# Patient Record
Sex: Female | Born: 1985 | Race: White | Hispanic: No | Marital: Married
Health system: Midwestern US, Community
[De-identification: ages and names within clinical notes are randomized; demographics above are authoritative.]

## PROBLEM LIST (undated history)

## (undated) ENCOUNTER — Inpatient Hospital Stay (HOSPITAL_COMMUNITY): Payer: Self-pay

## (undated) DIAGNOSIS — J45901 Unspecified asthma with (acute) exacerbation: Secondary | ICD-10-CM

## (undated) DIAGNOSIS — O24419 Gestational diabetes mellitus in pregnancy, unspecified control: Secondary | ICD-10-CM

## (undated) DIAGNOSIS — N83209 Unspecified ovarian cyst, unspecified side: Secondary | ICD-10-CM

## (undated) DIAGNOSIS — E282 Polycystic ovarian syndrome: Secondary | ICD-10-CM

## (undated) HISTORY — PX: OTHER SURGICAL HISTORY: SHX169

## (undated) HISTORY — DX: Polycystic ovarian syndrome: E28.2

## (undated) HISTORY — PX: WISDOM TOOTH EXTRACTION: SHX21

---
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
## 2003-05-28 ENCOUNTER — Encounter: Admission: RE | Admit: 2003-05-28 | Discharge: 2003-08-26 | Payer: Self-pay | Admitting: Psychology

## 2004-11-26 ENCOUNTER — Emergency Department (HOSPITAL_COMMUNITY): Admission: EM | Admit: 2004-11-26 | Discharge: 2004-11-26 | Payer: Self-pay

## 2006-05-14 ENCOUNTER — Inpatient Hospital Stay (HOSPITAL_COMMUNITY): Admission: AD | Admit: 2006-05-14 | Discharge: 2006-05-15 | Payer: Self-pay | Admitting: Gynecology

## 2006-05-17 ENCOUNTER — Inpatient Hospital Stay (HOSPITAL_COMMUNITY): Admission: AD | Admit: 2006-05-17 | Discharge: 2006-05-17 | Payer: Self-pay | Admitting: Obstetrics and Gynecology

## 2006-06-01 ENCOUNTER — Encounter: Payer: Self-pay | Admitting: Obstetrics and Gynecology

## 2006-06-01 ENCOUNTER — Ambulatory Visit: Payer: Self-pay | Admitting: Obstetrics and Gynecology

## 2006-07-01 ENCOUNTER — Ambulatory Visit (HOSPITAL_COMMUNITY): Admission: RE | Admit: 2006-07-01 | Discharge: 2006-07-01 | Payer: Self-pay | Admitting: Obstetrics and Gynecology

## 2006-08-10 ENCOUNTER — Ambulatory Visit: Payer: Self-pay | Admitting: Obstetrics & Gynecology

## 2006-09-21 ENCOUNTER — Ambulatory Visit (HOSPITAL_COMMUNITY): Admission: RE | Admit: 2006-09-21 | Discharge: 2006-09-21 | Payer: Self-pay | Admitting: Obstetrics & Gynecology

## 2006-09-21 ENCOUNTER — Ambulatory Visit: Payer: Self-pay | Admitting: Obstetrics & Gynecology

## 2006-09-21 ENCOUNTER — Encounter: Payer: Self-pay | Admitting: Obstetrics & Gynecology

## 2006-09-23 ENCOUNTER — Inpatient Hospital Stay (HOSPITAL_COMMUNITY): Admission: AD | Admit: 2006-09-23 | Discharge: 2006-09-23 | Payer: Self-pay | Admitting: Gynecology

## 2006-10-05 ENCOUNTER — Ambulatory Visit: Payer: Self-pay | Admitting: Gynecology

## 2006-10-25 ENCOUNTER — Ambulatory Visit: Payer: Self-pay | Admitting: Physician Assistant

## 2006-10-25 ENCOUNTER — Inpatient Hospital Stay (HOSPITAL_COMMUNITY): Admission: AD | Admit: 2006-10-25 | Discharge: 2006-10-25 | Payer: Self-pay | Admitting: Gynecology

## 2007-03-05 ENCOUNTER — Inpatient Hospital Stay (HOSPITAL_COMMUNITY): Admission: AD | Admit: 2007-03-05 | Discharge: 2007-03-05 | Payer: Self-pay | Admitting: Obstetrics and Gynecology

## 2007-11-16 ENCOUNTER — Inpatient Hospital Stay (HOSPITAL_COMMUNITY): Admission: AD | Admit: 2007-11-16 | Discharge: 2007-11-16 | Payer: Self-pay | Admitting: Gynecology

## 2008-02-12 ENCOUNTER — Inpatient Hospital Stay (HOSPITAL_COMMUNITY): Admission: AD | Admit: 2008-02-12 | Discharge: 2008-02-12 | Payer: Self-pay | Admitting: Obstetrics & Gynecology

## 2008-10-01 ENCOUNTER — Emergency Department (HOSPITAL_COMMUNITY): Admission: EM | Admit: 2008-10-01 | Discharge: 2008-10-01 | Payer: Self-pay | Admitting: Emergency Medicine

## 2009-05-03 HISTORY — PX: OVARIAN CYST REMOVAL: SHX89

## 2010-09-15 NOTE — H&P (Signed)
Shawna Travis, Shawna Travis                 ACCOUNT NO.:  [id number]   MEDICAL RECORD NO.:  [id number]          PATIENT TYPE:  WOC   LOCATION:  WOC                          FACILITY:  [facility]   PHYSICIAN:  Lesly Dukes, M.D. DATE OF BIRTH:  06/20/1985   DATE OF ADMISSION:  09/21/2006  DATE OF DISCHARGE:                              HISTORY & PHYSICAL   The patient is a 25 year old female, G6, para 0-0-6-0, who has a known  dermoid cyst diagnosed in 2006 and then ultrasound repeated in February  2008, which shows a 4.9 x 3.7 x 4.4 complex cyst in the right ovary.  She has occasional pain but nothing acute.  The patient was consented  for removal on August 10, 2006, which include bleeding, infection, damage  to the intra-abdominal organs, bowel, bladder, ureter, ovaries and  fallopian tubes.   PAST MEDICAL HISTORY:  High cholesterol.   PAST SURGICAL HISTORY:  None.   PAST OBSTETRICAL HISTORY:  Six first trimester miscarriages with a  __________ -negative workup.   GYN HISTORY:  Last Pap smear was done in January 2008, which was  negative.  She has never had a mammogram.  She is not on birth control  and would like to become pregnant.  She has never had a colonoscopy.  She had menarche at age 57.  Her cycles are 27 days long and periods  last 5 days.  Flow is medium with mild pain and no bleeding between  periods.  She has had no other ovarian cysts, fibroid tumors or sexually  transmitted diseases that she reports.   ALLERGIES:  PENICILLIN and MORPHINE.   FAMILY HISTORY:  Aunt has diabetes.  Father has high blood pressure.   REVIEW OF SYSTEMS:  Last visit was negative for chest pain, shortness of  breath.  She does have frequent headaches and dizzy spells.  Has had  some weight gain; however, her TSH is normal.   SOCIAL HISTORY:  She lives with her boyfriend.  She smokes 1 pack per  day for 6 years.  She drinks 5 caffeinated beverages a day and drinks no  alcohol.   PHYSICAL  EXAMINATION:  VITAL SIGNS:  During her last visit, pulse 84,  blood pressure 103/69, weight 161.8, height 5 feet 3 inches.  GENERAL:  Well-nourished, well-developed, no apparent distress.  HEENT:  Normocephalic, atraumatic.  NECK:  Supple.  Thyroid symmetrical with no dominant masses.  BREASTS:  Within normal limits.  HEART:  Regular rate and rhythm.  LUNGS:  Clear to auscultation bilaterally.  ABDOMEN:  Soft, nontender.  No mass, no organomegaly.  GENITALIA:  Tanner V.  Vagina pink, normal rugae.  Cervix clean,  nulliparous, anteverted.  Adnexa:  No masses, nontender.  No cul-de-sac  mass.  EXTREMITIES:  No edema, nontender.   ASSESSMENT AND PLAN:  A 25 year old female with dermoid cyst.  The  patient is to have diagnostic laparoscopy, cystectomy and possible  oophorectomy.  Patient for surgery May 21.           ______________________________  Lesly Dukes, M.D.     KHL/MEDQ  D:  09/19/2006  T:  09/20/2006  Job:  [id number]

## 2010-09-15 NOTE — Op Note (Signed)
Shawna Travis, ASFOUR                 ACCOUNT NO.:  [id number]   MEDICAL RECORD NO.:  [id number]          PATIENT TYPE:  AMB   LOCATION:  SDC                           [facility]   PHYSICIAN:  Lesly Dukes, M.D. DATE OF BIRTH:  04/02/1986   DATE OF PROCEDURE:  09/21/2006  DATE OF DISCHARGE:                               OPERATIVE REPORT   PREOPERATIVE DIAGNOSIS:  A 25 year old female with right dermoid cyst.   POSTOPERATIVE DIAGNOSES:  52. A 25 year old female with right dermoid cyst.  2. Grossly normal uterus, ovaries and fallopian tubes otherwise.   PROCEDURE PERFORMED:  1. Diagnostic laparoscopy.  2. Right ovarian cystectomy.   SURGEON:  Lesly Dukes, M.D.   ASSISTANT:  Ginger Carne, M.D.   ANESTHESIA:  General.   SPECIMENS:  Right ovarian cyst.   ESTIMATED BLOOD LOSS:  Minimal.   COMPLICATIONS:  None.   PROCEDURE:  After informed consent was obtained, the patient was taken  to the operating room where general anesthesia was found to be adequate.  The patient was placed in dorsal lithotomy position and prepped and  draped in the normal sterile fashion.  A Foley was in the bladder.   The uterine retractor was placed into the uterus and the uterus was  anteverted.  Gown and gloves changed.   Using the open method, an infraumbilical skin incision was made and  carried down under direct visualization to the peritoneum which was then  entered bluntly.  Retractors were placed into the abdomen and a Hassan  trocar was introduced intra-abdominally.  Pneumoperitoneum was achieved.  The fascia was also tagged with 0 Vicryl.   Survey of abdominal contents revealed an approximately 4 x 4 right  ovarian cyst.  The left ovary had a corpus luteum.  The uterus was  grossly normal.  No evidence of endometriosis or scarring.  The base of  the cyst was incised with the EndoShears and then, using graspers, the  dermoid cyst was pulled away.  Hemostasis was achieved with  bipolar  cautery using an Endopouch and a 5 mm scope.  The dermoid was removed  from the umbilicus.   The umbilical skin incision had to be extended a few mm in order to  achieve removal of the right ovarian cyst. Once the cyst was removed,  the 10 mm scope was introduced into the abdomen and hemostasis was  assured.  All instruments were removed from the patient's abdomen.  The  pneumoperitoneum was released.  The laparoscope and Hassan trocar were  removed in one single motion.   The fascia was visualized and closed with 0 Vicryl in a running fashion.  Subcutaneous tissue was hemostatic and the skin in the umbilical  incision was closed in a subcutaneous manner with 4-0 Vicryl.  The three  port sites, that were in the lower quadrants, were closed with  Dermabond.  The Pelosi retractor was removed from the cervix and the  cervix was hemostatic.   The patient tolerated the procedure well.  The sponge, lap, instrument  and needle count were correct x two.  The patient went to the recovery  room in stable condition.           ______________________________  Lesly Dukes, M.D.     KHL/MEDQ  D:  09/21/2006  T:  09/21/2006  Job:  [id number]

## 2010-09-18 NOTE — Group Therapy Note (Signed)
NAMEFLETCHER, Shawna Travis                 ACCOUNT NO.:  [id number]   MEDICAL RECORD NO.:  [id number]          PATIENT TYPE:  WOC   LOCATION:  [facility]                   FACILITY:  [facility]   PHYSICIAN:  Elsie Lincoln, MD      DATE OF BIRTH:  February 13, 1986   DATE OF SERVICE:                                  CLINIC NOTE   FOLLOWUP:  The patient is a 25 year old female who presents for follow  up of her dermoid cyst.  The dermoid cyst is still there, which measures  approximately 4.9 x 3.7 x 4.4.  It is stable since 2006.  Patient  understands that this needs to come out surgically and will plan for a  laparoscopic cystectomy.  She understands it will be done in the next 2  months.  She has consented for the procedure which includes bleeding,  infection, damage to the intraabdominal organs.  She has had multiple  miscarriages in the past.  She had a limited workup, which included TSH,  ANA, anti-cardiolipin, which were all negative.  We just did a random  glucose today which was 89, so I do not believe she has diabetes either.  If she obtains insurance, we could do more workup for her recurrent  miscarriages.   PLAN:  Patient will be scheduled for surgery and sent a letter.           ______________________________  Elsie Lincoln, MD     KL/MEDQ  D:  08/10/2006  T:  08/10/2006  Job:  [id number]
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
## 2011-01-29 LAB — WET PREP, GENITAL: Trich, Wet Prep: NONE SEEN

## 2011-01-29 LAB — CBC
MCHC: 34.4
Platelets: 257
RBC: 4.7

## 2011-01-29 LAB — URINALYSIS, ROUTINE W REFLEX MICROSCOPIC
Hgb urine dipstick: NEGATIVE
Ketones, ur: NEGATIVE
Protein, ur: NEGATIVE
Specific Gravity, Urine: 1.01
Urobilinogen, UA: 0.2
pH: 5.5

## 2011-02-01 LAB — URINALYSIS, ROUTINE W REFLEX MICROSCOPIC
Glucose, UA: NEGATIVE
Hgb urine dipstick: NEGATIVE
Ketones, ur: NEGATIVE
Protein, ur: NEGATIVE
Specific Gravity, Urine: 1.015
Urobilinogen, UA: 0.2
pH: 7

## 2011-02-09 LAB — POCT PREGNANCY, URINE
Operator id: 26329
Operator id: 263291
Preg Test, Ur: NEGATIVE

## 2011-02-09 LAB — URINALYSIS, ROUTINE W REFLEX MICROSCOPIC
Bilirubin Urine: NEGATIVE
Glucose, UA: NEGATIVE
Urobilinogen, UA: 0.2

## 2011-02-09 LAB — CBC
HCT: 40.9
Hemoglobin: 14.5
MCV: 91.2
Platelets: 296
RDW: 12.6
WBC: 8

## 2011-02-09 LAB — URINE MICROSCOPIC-ADD ON

## 2011-02-09 LAB — HCG, QUANTITATIVE, PREGNANCY: hCG, Beta Chain, Quant, S: <2

## 2012-05-27 ENCOUNTER — Ambulatory Visit: Payer: BC Managed Care – PPO

## 2012-05-27 ENCOUNTER — Ambulatory Visit: Payer: BC Managed Care – PPO | Admitting: Emergency Medicine

## 2012-05-27 VITALS — BP 111/71 | HR 68 | Temp 97.7°F | Resp 16 | Ht 64.5 in | Wt 185.6 lb

## 2012-05-27 DIAGNOSIS — M2390 Unspecified internal derangement of unspecified knee: Secondary | ICD-10-CM

## 2012-05-27 MED ORDER — HYDROCODONE-ACETAMINOPHEN 5-325 MG PO TABS: 1.0000 | ORAL_TABLET | ORAL | Status: DC | PRN

## 2012-05-27 NOTE — Progress Notes (Signed)
[facility] [address] [place] [address] [phone]  Date:  05/27/2012   Name:  Shawna Travis   DOB:  11/21/1985   MRN:  [id number]  PCP:  No primary provider on file.    Chief Complaint: Knee Pain   History of Present Illness:  Shawna Travis is a 27 y.o. very pleasant female patient who presents with the following:  Slipped on the floor on a patch of flour and says her left knee "gave out" and she heard a pop and she fell to the floor. Occurred night before last.  No prior knee injury.   Not clicking or locking, just stiff and painful.  Unable to sleep last night or work due to pain.  There is no problem list on file for this patient.   History reviewed. No pertinent past medical history.  Past Surgical History  Procedure Date  . Cyst on ovary     History  Substance Use Topics  . Smoking status: Current Every Day Smoker -- 15.0 packs/day    Types: Cigarettes  . Smokeless tobacco: Not on file  . Alcohol Use: Yes    Family History  Problem Relation Age of Onset  . Hypertension Father   . Hyperlipidemia Father   . Heart disease Sister   . Heart disease Maternal Grandmother     Allergies  Allergen Reactions  . Sulfa Antibiotics Other (See Comments)    Red Blotches    Medication list has been reviewed and updated.  No current outpatient prescriptions on file prior to visit.    Review of Systems:  As per HPI, otherwise negative.    Physical Examination: Filed Vitals:   05/27/12 1211  BP: 111/71  Pulse: 68  Temp: 97.7 F (36.5 C)  Resp: 16   Filed Vitals:   05/27/12 1211  Height: 5' 4.5" (1.638 m)  Weight: 185 lb 9.6 oz (84.188 kg)   Body mass index is 31.37 kg/(m^2). Ideal Body Weight: Weight in (lb) to have BMI = 25: 147.6    GEN: WDWN, NAD, Non-toxic, Alert & Oriented x 3 HEENT: Atraumatic, Normocephalic.  Ears and Nose: No external deformity. EXTR: No clubbing/cyanosis/edema NEURO: Normal gait.  PSYCH:  Normally interactive. Conversant. Not depressed or anxious appearing.  Calm demeanor.  Knee:  Left knee swollen and tender. Moderate effusion.  Guards flexion past 90 degrees and full extension.  Grossly stable  Assessment and Plan: Internal derangement knee Knee immobilizer Crutches NWB vicodin Ice and elevation Follow up in one week  Carmelina Dane, MD

## 2012-05-27 NOTE — Progress Notes (Signed)
  Subjective:    Patient ID: Shawna Travis, female    DOB: 13-Nov-1985, 27 y.o.   MRN: [id number]  HPI    Review of Systems     Objective:   Physical Exam        Assessment & Plan:  [facility] reading (PRIMARY ) by  Dr. Dareen Piano.  Normal knee.

## 2012-05-27 NOTE — Patient Instructions (Addendum)
Knee Sprain  A knee sprain is a tear in one of the strong, fibrous tissues that connect the bones (ligaments) in your knee. The severity of the sprain depends on how much of the ligament is torn. The tear can be either partial or complete.  CAUSES   Often, sprains are a result of a fall or injury. The force of the impact causes the fibers of your ligament to stretch too much. This excess tension causes the fibers of your ligament to tear.  SYMPTOMS   You may have some loss of motion in your knee. Other symptoms include:   Bruising.   Tenderness.   Swelling.  DIAGNOSIS   In order to diagnose knee sprain, your caregiver will physically examine your knee to determine how torn the ligament is. Your caregiver may also suggest an X-ray exam of your knee to make sure no bones are broken.  TREATMENT   If your ligament is only partially torn, treatment usually involves keeping the knee in a fixed position (immobilization) or bracing your knee for activities that require movement for several weeks. To do this, your caregiver will apply a bandage, cast, or splint to keep your knee from moving or support your knee during movement until it heals. For a partially torn ligament, the healing process usually takes 4 to 6 weeks.  If your ligament is completely torn, depending on which ligament it is, you may need surgery to reconnect the ligament to the bone or reconstruct it. After surgery, a cast or splint may be applied and will need to stay on your knee for 4 to 6 weeks while your ligament heals.  HOME CARE INSTRUCTIONS   Keep your injured knee elevated to decrease swelling.   To ease pain and swelling, apply ice to your knee twice a day, for 2 to 3 days:   Put ice in a plastic bag.   Place a towel between your skin and the bag.   Leave the ice on for 15 minutes.   Only take over-the-counter or prescription medicine for pain as directed by your caregiver.   Do not leave your knee unprotected until pain and stiffness go  away (usually 4 to 6 weeks).   Do not allow your cast or splint to get wet. If you have been instructed not to remove it, cover your cast or splint with a plastic bag when you shower or bathe. Do not swim.   Your caregiver may suggest exercises for you to do during your recovery to prevent or limit permanent weakness and stiffness.  SEEK IMMEDIATE MEDICAL CARE IF:   Your cast or splint becomes damaged.   Your pain becomes worse.  MAKE SURE YOU:   Understand these instructions.   Will watch your condition.   Will get help right away if you are not doing well or get worse.  Document Released: 04/19/2005 Document Revised: 07/12/2011 Document Reviewed: 04/03/2011  [employer] Patient Information 2013 [employer].

## 2012-06-09 ENCOUNTER — Emergency Department (HOSPITAL_COMMUNITY)
Admission: EM | Admit: 2012-06-09 | Discharge: 2012-06-09 | Disposition: A | Discharge status: Home / Self Care | Payer: BC Managed Care – PPO | Attending: Emergency Medicine | Admitting: Emergency Medicine

## 2012-06-09 ENCOUNTER — Encounter (HOSPITAL_COMMUNITY): Payer: Self-pay

## 2012-06-09 ENCOUNTER — Emergency Department (HOSPITAL_COMMUNITY): Payer: BC Managed Care – PPO

## 2012-06-09 DIAGNOSIS — Y990 Civilian activity done for income or pay: Secondary | ICD-10-CM | POA: Insufficient documentation

## 2012-06-09 DIAGNOSIS — F17200 Nicotine dependence, unspecified, uncomplicated: Secondary | ICD-10-CM | POA: Insufficient documentation

## 2012-06-09 DIAGNOSIS — W010XXA Fall on same level from slipping, tripping and stumbling without subsequent striking against object, initial encounter: Secondary | ICD-10-CM | POA: Insufficient documentation

## 2012-06-09 DIAGNOSIS — W19XXXA Unspecified fall, initial encounter: Secondary | ICD-10-CM

## 2012-06-09 DIAGNOSIS — S99929A Unspecified injury of unspecified foot, initial encounter: Secondary | ICD-10-CM | POA: Insufficient documentation

## 2012-06-09 DIAGNOSIS — Y9269 Other specified industrial and construction area as the place of occurrence of the external cause: Secondary | ICD-10-CM | POA: Insufficient documentation

## 2012-06-09 DIAGNOSIS — M25569 Pain in unspecified knee: Secondary | ICD-10-CM

## 2012-06-09 DIAGNOSIS — S8990XA Unspecified injury of unspecified lower leg, initial encounter: Secondary | ICD-10-CM | POA: Insufficient documentation

## 2012-06-09 DIAGNOSIS — Z8742 Personal history of other diseases of the female genital tract: Secondary | ICD-10-CM | POA: Insufficient documentation

## 2012-06-09 MED ORDER — MELOXICAM 7.5 MG PO TABS: 7.5000 mg | ORAL_TABLET | Freq: Every day | ORAL | Status: DC

## 2012-06-09 NOTE — ED Notes (Signed)
[id number]<AV> Expected date:<BR> Expected time:<BR> Means of arrival:<BR> Comments:<BR> Fall, knee swelling

## 2012-06-09 NOTE — ED Provider Notes (Signed)
Medical screening examination/treatment/procedure(s) were performed by non-physician practitioner and as supervising physician I was immediately available for consultation/collaboration.   Frimy Uffelman, MD 06/09/12 2354 

## 2012-06-09 NOTE — ED Provider Notes (Signed)
History     CSN: [id number]  Arrival date & time 06/09/12  [id number]   First MD Initiated Contact with Patient 06/09/12 1854      Chief Complaint  Patient presents with  . Knee Injury    (Consider location/radiation/quality/duration/timing/severity/associated sxs/prior treatment) HPI 27 year old female presents to emergency department with chief complaint of left knee pain after mechanical fall.  Patient was at work this evening and slipped on the piece of ice.  She felt her left knee gave way and slide out of position and hyperextension.  Patient states that she felt immediate swelling and pain in the left knee.  She denies hitting her knee on the ground.  She denies any other trauma.  Patient states that she was seen approximately one week ago for a different complaint about to see me she has knee immobilizer at home currently. History reviewed. No pertinent past medical history.  Past Surgical History  Procedure Date  . Cyst on ovary     Family History  Problem Relation Age of Onset  . Hypertension Father   . Hyperlipidemia Father   . Heart disease Sister   . Heart disease Maternal Grandmother     History  Substance Use Topics  . Smoking status: Current Every Day Smoker -- 15.0 packs/day    Types: Cigarettes  . Smokeless tobacco: Not on file  . Alcohol Use: Yes    OB History    Grav Para Term Preterm Abortions TAB SAB Ect Mult Living                  Review of Systems Ten systems reviewed and are negative for acute change, except as noted in the HPI.   Allergies  Sulfa antibiotics  Home Medications  No current outpatient prescriptions on file.  BP 131/65  Pulse 73  Temp 98.3 F (36.8 C) (Oral)  Resp 14  SpO2 99%  LMP 05/08/2012  Physical Exam  Physical Exam  Nursing note and vitals reviewed. Constitutional: She is oriented to person, place, and time. She appears well-developed and well-nourished. No distress.  HENT:  Head: Normocephalic and  atraumatic.  Eyes: Conjunctivae normal and EOM are normal. Pupils are equal, round, and reactive to light. No scleral icterus.  Neck: Normal range of motion.  Cardiovascular: Normal rate, regular rhythm and normal heart sounds.  Exam reveals no gallop and no friction rub.   No murmur heard. Pulmonary/Chest: Effort normal and breath sounds normal. No respiratory distress.  Abdominal: Soft. Bowel sounds are normal. She exhibits no distension and no mass. There is no tenderness. There is no guarding.  Musculoskeletal: Knee exam: the injured knee reveals soft tissue tenderness over joint line and greater on the medial side, ecchymosis of anterior knee inferior to the patella, reduced range of motion, exam limited by acuity of pain.  Neurological: She is alert and oriented to person, place, and time.  Skin: Skin is warm and dry. She is not diaphoretic.    ED Course  Procedures (including critical care time)  Labs Reviewed - No data to display No results found.   No diagnosis found.    MDM  8:23 PM BP 131/65  Pulse 73  Temp 98.3 F (36.8 C) (Oral)  Resp 14  SpO2 99%  LMP 05/06/2012 Patient states that her pain is okay while she is sitting still.  Worse with movement.  I suspect possible ligamentous damage to 2 sudden on set of pain and swelling.  Patient x-rays are pending.  I  have given report to PA PAZ has agreed to assume care of the patient and disposition her.        Arthor Captain, PA-C 06/09/12 2024

## 2012-06-09 NOTE — ED Notes (Addendum)
Per EMS, Pt, from work, slipped and fell on a piece of ice.  Pt sts "my left knee bent weird, almost to the side."  Pain score 5/10.  Fentanyl given in route.  Swelling noted.  Denies numbness and tingling.  Pulses are good.  Vitals are stable.

## 2012-06-09 NOTE — ED Provider Notes (Signed)
LEFT KNEE - COMPLETE 4+ VIEW Comparison: 11/26/2004 Findings: No fracture or dislocation is seen. The joint spaces are preserved. The visualized soft tissues are unremarkable. Small suprapatellar knee joint effusion. IMPRESSION: No fracture or dislocation is seen. Small suprapatellar knee joint effusion.   Pt care resumed from [place]. Imaging reviewed. Discussed conservative home therapies and orthop follow up w pt. She states she already has knee immobilizer and crutches. Will dc w meloxicam.    Jaci Carrel, PA-C 06/09/12 2111

## 2012-06-10 NOTE — ED Provider Notes (Signed)
Medical screening examination/treatment/procedure(s) were performed by non-physician practitioner and as supervising physician I was immediately available for consultation/collaboration.   Gwyneth Sprout, MD 06/10/12 [phone]

## 2012-06-23 ENCOUNTER — Ambulatory Visit: Payer: BC Managed Care – PPO | Attending: Orthopedic Surgery | Admitting: Physical Therapy

## 2012-06-23 DIAGNOSIS — IMO0001 Reserved for inherently not codable concepts without codable children: Secondary | ICD-10-CM | POA: Insufficient documentation

## 2012-06-23 DIAGNOSIS — M25569 Pain in unspecified knee: Secondary | ICD-10-CM | POA: Insufficient documentation

## 2012-06-23 DIAGNOSIS — M25669 Stiffness of unspecified knee, not elsewhere classified: Secondary | ICD-10-CM | POA: Insufficient documentation

## 2012-06-23 DIAGNOSIS — R262 Difficulty in walking, not elsewhere classified: Secondary | ICD-10-CM | POA: Insufficient documentation

## 2012-06-27 ENCOUNTER — Ambulatory Visit: Payer: BC Managed Care – PPO | Admitting: Physical Therapy

## 2012-06-29 ENCOUNTER — Ambulatory Visit: Payer: BC Managed Care – PPO | Admitting: Physical Therapy

## 2012-07-03 ENCOUNTER — Ambulatory Visit: Payer: BC Managed Care – PPO | Attending: Orthopedic Surgery | Admitting: Physical Therapy

## 2012-07-03 DIAGNOSIS — M25669 Stiffness of unspecified knee, not elsewhere classified: Secondary | ICD-10-CM | POA: Insufficient documentation

## 2012-07-03 DIAGNOSIS — IMO0001 Reserved for inherently not codable concepts without codable children: Secondary | ICD-10-CM | POA: Insufficient documentation

## 2012-07-03 DIAGNOSIS — M25569 Pain in unspecified knee: Secondary | ICD-10-CM | POA: Insufficient documentation

## 2012-07-03 DIAGNOSIS — R262 Difficulty in walking, not elsewhere classified: Secondary | ICD-10-CM | POA: Insufficient documentation

## 2012-07-06 ENCOUNTER — Ambulatory Visit: Payer: BC Managed Care – PPO | Admitting: Physical Therapy

## 2012-07-24 ENCOUNTER — Emergency Department (HOSPITAL_COMMUNITY): Payer: BC Managed Care – PPO

## 2012-07-24 ENCOUNTER — Encounter (HOSPITAL_COMMUNITY): Payer: Self-pay | Admitting: Nurse Practitioner

## 2012-07-24 ENCOUNTER — Emergency Department (HOSPITAL_COMMUNITY)
Admission: EM | Admit: 2012-07-24 | Discharge: 2012-07-24 | Disposition: A | Discharge status: Home / Self Care | Payer: BC Managed Care – PPO | Attending: Emergency Medicine | Admitting: Emergency Medicine

## 2012-07-24 DIAGNOSIS — Z87891 Personal history of nicotine dependence: Secondary | ICD-10-CM | POA: Insufficient documentation

## 2012-07-24 DIAGNOSIS — R300 Dysuria: Secondary | ICD-10-CM | POA: Insufficient documentation

## 2012-07-24 DIAGNOSIS — O9989 Other specified diseases and conditions complicating pregnancy, childbirth and the puerperium: Secondary | ICD-10-CM | POA: Insufficient documentation

## 2012-07-24 DIAGNOSIS — R1031 Right lower quadrant pain: Secondary | ICD-10-CM | POA: Insufficient documentation

## 2012-07-24 DIAGNOSIS — Z3490 Encounter for supervision of normal pregnancy, unspecified, unspecified trimester: Secondary | ICD-10-CM

## 2012-07-24 DIAGNOSIS — Z8742 Personal history of other diseases of the female genital tract: Secondary | ICD-10-CM | POA: Insufficient documentation

## 2012-07-24 DIAGNOSIS — R110 Nausea: Secondary | ICD-10-CM | POA: Insufficient documentation

## 2012-07-24 DIAGNOSIS — N898 Other specified noninflammatory disorders of vagina: Secondary | ICD-10-CM | POA: Insufficient documentation

## 2012-07-24 DIAGNOSIS — B9689 Other specified bacterial agents as the cause of diseases classified elsewhere: Secondary | ICD-10-CM

## 2012-07-24 HISTORY — DX: Unspecified ovarian cyst, unspecified side: N83.209

## 2012-07-24 LAB — URINE MICROSCOPIC-ADD ON

## 2012-07-24 LAB — URINALYSIS, ROUTINE W REFLEX MICROSCOPIC
Bilirubin Urine: NEGATIVE
Glucose, UA: NEGATIVE mg/dL
Hgb urine dipstick: NEGATIVE
Specific Gravity, Urine: 1.023 (ref 1.005–1.030)
pH: 8 (ref 5.0–8.0)

## 2012-07-24 LAB — HCG, QUANTITATIVE, PREGNANCY: hCG, Beta Chain, Quant, S: 875 m[IU]/mL — ABNORMAL HIGH (ref <5)

## 2012-07-24 MED ORDER — METRONIDAZOLE 500 MG PO TABS: 500.0000 mg | ORAL_TABLET | Freq: Two times a day (BID) | ORAL | Status: DC

## 2012-07-24 NOTE — ED Notes (Signed)
Per pt:  Left and right lower abdominal pain (dull and intermittent) along with lower back pain began 2 weeks ago.  Pt took two at home pregnancy tests last night which were positive.  This morning the pain intensified at 0700;  became a sharp pain as opposed to the dull pain preceding this morning.  Pt has had nausea for two weeks, but denies V/D or vaginal bleeding.   Pt states that she has burning associated with urination and urine is foul-smelling.  LMP per pt was 06/19/12.

## 2012-07-24 NOTE — ED Provider Notes (Signed)
History     CSN: [id number]  Arrival date & time 07/24/12  1550   First MD Initiated Contact with Patient 07/24/12 1617      Chief Complaint  Patient presents with  . Abdominal Pain    (Consider location/radiation/quality/duration/timing/severity/associated sxs/prior treatment) Patient is a 27 y.o. female presenting with abdominal pain. The history is provided by the patient.  Abdominal Pain Pain location:  RLQ Pain quality: aching and cramping   Pain radiates to:  Does not radiate Pain severity:  Mild Onset quality:  Gradual Duration:  2 weeks Timing:  Intermittent Progression:  Worsening Chronicity:  New Context: not alcohol use   Relieved by:  Nothing Worsened by:  Position changes Ineffective treatments:  None tried Associated symptoms: dysuria, nausea and vaginal discharge   Associated symptoms: no chills, no diarrhea, no fever, no hematuria and no shortness of breath     Past Medical History  Diagnosis Date  . Ovarian cyst     2011    Past Surgical History  Procedure Laterality Date  . Cyst on ovary    . Ovarian cyst removal  2011    Family History  Problem Relation Age of Onset  . Hypertension Father   . Hyperlipidemia Father   . Heart disease Sister   . Heart disease Maternal Grandmother     History  Substance Use Topics  . Smoking status: Former Smoker -- 15.00 packs/day    Types: Cigarettes    Quit date: 07/23/2012  . Smokeless tobacco: Never Used  . Alcohol Use: No    OB History   Grav Para Term Preterm Abortions TAB SAB Ect Mult Living   1    1  1          Review of Systems  Constitutional: Negative for fever and chills.  Respiratory: Negative for shortness of breath.   Gastrointestinal: Positive for nausea and abdominal pain. Negative for diarrhea.  Genitourinary: Positive for dysuria and vaginal discharge. Negative for hematuria.  All other systems reviewed and are negative.  pt notes positive home pregnancy--G2P0 and Rh  neg  Allergies  Sulfa antibiotics  Home Medications   Current Outpatient Rx  Name  Route  Sig  Dispense  Refill  . acetaminophen (TYLENOL) 325 MG tablet   Oral   Take 650 mg by mouth every 6 (six) hours as needed for pain.         Marland Kitchen lactulose (CHRONULAC) 10 GM/15ML solution   Oral   Take 20 g by mouth 2 (two) times daily.           BP 125/98  Pulse 72  Temp(Src) 98 F (36.7 C) (Oral)  Resp 16  SpO2 100%  LMP 06/19/2012  Physical Exam  Nursing note and vitals reviewed. Constitutional: She is oriented to person, place, and time. She appears well-developed and well-nourished.  Non-toxic appearance. No distress.  HENT:  Head: Normocephalic and atraumatic.  Eyes: Conjunctivae, EOM and lids are normal. Pupils are equal, round, and reactive to light.  Neck: Normal range of motion. Neck supple. No tracheal deviation present. No mass present.  Cardiovascular: Normal rate, regular rhythm and normal heart sounds.  Exam reveals no gallop.   No murmur heard. Pulmonary/Chest: Effort normal and breath sounds normal. No stridor. No respiratory distress. She has no decreased breath sounds. She has no wheezes. She has no rhonchi. She has no rales.  Abdominal: Soft. Normal appearance and bowel sounds are normal. She exhibits no distension. There is tenderness in the  right lower quadrant. There is no rigidity, no rebound, no guarding and no CVA tenderness.  Genitourinary: No bleeding around the vagina. Vaginal discharge found.  Musculoskeletal: Normal range of motion. She exhibits no edema and no tenderness.  Neurological: She is alert and oriented to person, place, and time. She has normal strength. No cranial nerve deficit or sensory deficit. GCS eye subscore is 4. GCS verbal subscore is 5. GCS motor subscore is 6.  Skin: Skin is warm and dry. No abrasion and no rash noted.  Psychiatric: She has a normal mood and affect. Her speech is normal and behavior is normal.    ED Course   Procedures (including critical care time)  Labs Reviewed  GC/CHLAMYDIA PROBE AMP  WET PREP, GENITAL  URINE CULTURE  HCG, QUANTITATIVE, PREGNANCY  URINALYSIS, ROUTINE W REFLEX MICROSCOPIC   No results found.   No diagnosis found.    MDM  Patient to be treated for bacterial vaginosis. Her beta hCG was noted as well as her pelvic ultrasound results. Patient likely with early pregnancy. Doubt that she has ectopic but because her beta hCG is too low this cannot be completely ruled out. Patient has a gynecologist and she was instructed to followup with her tomorrow and to arrange for a repeat beta hCG in 2 days to check for doubling. She has no vaginal bleeding at this time. She has no signs of peritonitis. Patient understands his instructions and they were also given to hein written format        Toy Baker, MD 07/24/12 [phone]

## 2012-07-25 LAB — GC/CHLAMYDIA PROBE AMP
CT Probe RNA: NEGATIVE
GC Probe RNA: NEGATIVE

## 2012-07-25 LAB — URINE CULTURE
Culture: NO GROWTH
Special Requests: NORMAL

## 2013-01-22 ENCOUNTER — Ambulatory Visit: Payer: Self-pay | Admitting: Nurse Practitioner

## 2013-01-31 ENCOUNTER — Ambulatory Visit: Payer: Self-pay | Admitting: Nurse Practitioner

## 2013-03-12 ENCOUNTER — Observation Stay: Payer: Self-pay | Admitting: Obstetrics and Gynecology

## 2013-03-24 ENCOUNTER — Inpatient Hospital Stay (HOSPITAL_COMMUNITY)
Admission: AD | Admit: 2013-03-24 | Discharge: 2013-03-24 | Disposition: A | Discharge status: Home / Self Care | Payer: BC Managed Care – PPO | Source: Ambulatory Visit | Attending: Obstetrics & Gynecology | Admitting: Obstetrics & Gynecology

## 2013-03-24 ENCOUNTER — Encounter (HOSPITAL_COMMUNITY): Payer: Self-pay | Admitting: *Deleted

## 2013-03-24 DIAGNOSIS — O26859 Spotting complicating pregnancy, unspecified trimester: Secondary | ICD-10-CM | POA: Insufficient documentation

## 2013-03-24 DIAGNOSIS — O479 False labor, unspecified: Secondary | ICD-10-CM | POA: Insufficient documentation

## 2013-03-24 DIAGNOSIS — M549 Dorsalgia, unspecified: Secondary | ICD-10-CM | POA: Insufficient documentation

## 2013-03-24 HISTORY — DX: Gestational diabetes mellitus in pregnancy, unspecified control: O24.419

## 2013-03-24 MED ORDER — ZOLPIDEM TARTRATE 5 MG PO TABS
5.0000 mg | ORAL_TABLET | Freq: Once | ORAL | Status: AC
  Administered 2013-03-24: 5 mg via ORAL
  Filled 2013-03-24: qty 1

## 2013-03-24 MED ORDER — ZOLPIDEM TARTRATE 5 MG PO TABS: 5.0000 mg | ORAL_TABLET | Freq: Every evening | ORAL | Status: DC | PRN

## 2013-03-24 NOTE — MAU Note (Signed)
I'm having a lot of pelvic pressure and back pain, esp when walking. Contractions are 4-5/hr. Some dark spotting for couple days

## 2013-03-28 ENCOUNTER — Inpatient Hospital Stay: Payer: Self-pay

## 2013-03-28 LAB — CBC WITH DIFFERENTIAL/PLATELET
Basophil #: 0 10*3/uL (ref 0.0–0.1)
Eosinophil #: 0.1 10*3/uL (ref 0.0–0.7)
Eosinophil %: 0.7 %
HCT: 38.2 % (ref 35.0–47.0)
HGB: 13.5 g/dL (ref 12.0–16.0)
Lymphocyte #: 2.5 10*3/uL (ref 1.0–3.6)
MCH: 32.6 pg (ref 26.0–34.0)
MCHC: 35.3 g/dL (ref 32.0–36.0)
MCV: 93 fL (ref 80–100)
Monocyte #: 0.6 x10 3/mm (ref 0.2–0.9)
Neutrophil #: 9.7 10*3/uL — ABNORMAL HIGH (ref 1.4–6.5)
RBC: 4.13 10*6/uL (ref 3.80–5.20)
RDW: 13.3 % (ref 11.5–14.5)
WBC: 12.9 10*3/uL — ABNORMAL HIGH (ref 3.6–11.0)

## 2013-03-29 LAB — HEMATOCRIT: HCT: 31.8 % — ABNORMAL LOW (ref 35.0–47.0)

## 2013-04-02 ENCOUNTER — Encounter (HOSPITAL_COMMUNITY): Payer: Self-pay | Admitting: *Deleted

## 2013-04-02 ENCOUNTER — Inpatient Hospital Stay (HOSPITAL_COMMUNITY)
Admission: AD | Admit: 2013-04-02 | Discharge: 2013-04-02 | Disposition: A | Discharge status: Home / Self Care | Payer: BC Managed Care – PPO | Source: Ambulatory Visit | Attending: Obstetrics and Gynecology | Admitting: Obstetrics and Gynecology

## 2013-04-02 DIAGNOSIS — O864 Pyrexia of unknown origin following delivery: Secondary | ICD-10-CM | POA: Insufficient documentation

## 2013-04-02 DIAGNOSIS — O9122 Nonpurulent mastitis associated with the puerperium: Secondary | ICD-10-CM | POA: Insufficient documentation

## 2013-04-02 DIAGNOSIS — R300 Dysuria: Secondary | ICD-10-CM | POA: Insufficient documentation

## 2013-04-02 LAB — CBC WITH DIFFERENTIAL/PLATELET
Basophils Relative: 0 % (ref 0–1)
HCT: 31 % — ABNORMAL LOW (ref 36.0–46.0)
Hemoglobin: 10.7 g/dL — ABNORMAL LOW (ref 12.0–15.0)
Lymphs Abs: 0.6 10*3/uL — ABNORMAL LOW (ref 0.7–4.0)
MCH: 31.9 pg (ref 26.0–34.0)
MCHC: 34.5 g/dL (ref 30.0–36.0)
Monocytes Absolute: 0.2 10*3/uL (ref 0.1–1.0)
Monocytes Relative: 2 % — ABNORMAL LOW (ref 3–12)
Neutro Abs: 6.8 10*3/uL (ref 1.7–7.7)
Neutrophils Relative %: 89 % — ABNORMAL HIGH (ref 43–77)
RBC: 3.35 MIL/uL — ABNORMAL LOW (ref 3.87–5.11)

## 2013-04-02 LAB — URINALYSIS, ROUTINE W REFLEX MICROSCOPIC
Bilirubin Urine: NEGATIVE
Glucose, UA: NEGATIVE mg/dL
Specific Gravity, Urine: 1.015 (ref 1.005–1.030)
Urobilinogen, UA: 0.2 mg/dL (ref 0.0–1.0)

## 2013-04-02 LAB — URINE MICROSCOPIC-ADD ON

## 2013-04-02 MED ORDER — IBUPROFEN 800 MG PO TABS
800.0000 mg | ORAL_TABLET | Freq: Once | ORAL | Status: AC
  Administered 2013-04-02: 800 mg via ORAL
  Filled 2013-04-02: qty 1

## 2013-04-02 MED ORDER — CEPHALEXIN 500 MG PO CAPS
500.0000 mg | ORAL_CAPSULE | Freq: Once | ORAL | Status: AC
  Administered 2013-04-02: 500 mg via ORAL
  Filled 2013-04-02: qty 1

## 2013-04-02 MED ORDER — CEPHALEXIN 500 MG PO CAPS: 500.0000 mg | ORAL_CAPSULE | Freq: Four times a day (QID) | ORAL | Status: AC

## 2013-04-02 NOTE — MAU Provider Note (Signed)
History     CSN: [id number]  Arrival date and time: 04/02/13 0000   First Provider Initiated Contact with Patient 04/02/13 0114      No chief complaint on file.  HPI Shawna Travis is a 27yo [id number] s/p vag del on 11/26 who presents for eval of fever & chills that began this evening.  She also complains of breast pain R>L and mild abd discomfort. Reports burning with urination but feels that is more due to her repaired laceration. Reports nl lochia. She has been pumping/breastfeeding her infant who recently underwent a tongue clip. Prior to this, pt reports her nipples were cracked and sore but states that is improving. She had a couple of times this evening where it was 4-5 hours between pumpings.  OB History   Grav Para Term Preterm Abortions TAB SAB Ect Mult Living   2    1  1   1       Past Medical History  Diagnosis Date  . Ovarian cyst     2011  . Gestational diabetes     Past Surgical History  Procedure Laterality Date  . Cyst on ovary    . Ovarian cyst removal  2011  . Wisdom tooth extraction      Family History  Problem Relation Age of Onset  . Hypertension Father   . Hyperlipidemia Father   . Heart disease Sister   . Heart disease Maternal Grandmother     History  Substance Use Topics  . Smoking status: Former Smoker -- 15.00 packs/day    Types: Cigarettes    Quit date: 07/23/2012  . Smokeless tobacco: Never Used  . Alcohol Use: No    Allergies:  Allergies  Allergen Reactions  . Sulfa Antibiotics Other (See Comments)    Red Blotches    Prescriptions prior to admission  Medication Sig Dispense Refill  . HYDROcodone-acetaminophen (NORCO/VICODIN) 5-325 MG per tablet Take 1 tablet by mouth every 6 (six) hours as needed for moderate pain.      Marland Kitchen ibuprofen (ADVIL,MOTRIN) 600 MG tablet Take 600 mg by mouth every 6 (six) hours as needed.      . Prenatal Vit-Fe Fumarate-FA (PRENATAL MULTIVITAMIN) TABS tablet Take 1 tablet by mouth at bedtime.      Marland Kitchen zolpidem  (AMBIEN) 10 MG tablet Take 10 mg by mouth at bedtime as needed for sleep.        ROS Physical Exam   Blood pressure 129/51, pulse 113, temperature 102.5 F (39.2 C), resp. rate 18, last menstrual period 06/19/2012, SpO2 98.00%, unknown if currently breastfeeding.  Physical Exam  Constitutional: She is oriented to person, place, and time. She appears well-developed.  HENT:  Head: Normocephalic.  Neck: Normal range of motion.  Cardiovascular:  Tachycardic 113-125  Respiratory: Effort normal.  Right breast with erythema on lower, inner quad. Left with possible faint redness developing but less defined than right.  GI:  Fundus firm and U/1; tender to pt report but no grimacing/guarding during palp  Musculoskeletal: Normal range of motion.  Neurological: She is alert and oriented to person, place, and time.  Skin: Skin is warm and dry.  Psychiatric: She has a normal mood and affect. Her behavior is normal. Thought content normal.   CBC    Component Value Date/Time   WBC 7.6 04/02/2013 0040   RBC 3.35* 04/02/2013 0040   HGB 10.7* 04/02/2013 0040   HCT 31.0* 04/02/2013 0040   PLT 239 04/02/2013 0040   MCV 92.5 04/02/2013 0040  MCH 31.9 04/02/2013 0040   MCHC 34.5 04/02/2013 0040   RDW 13.0 04/02/2013 0040   LYMPHSABS 0.6* 04/02/2013 0040   MONOABS 0.2 04/02/2013 0040   EOSABS 0.1 04/02/2013 0040   BASOSABS 0.0 04/02/2013 0040   Urinalysis    Component Value Date/Time   COLORURINE YELLOW 04/02/2013 0049   APPEARANCEUR CLEAR 04/02/2013 0049   LABSPEC 1.015 04/02/2013 0049   PHURINE 6.0 04/02/2013 0049   GLUCOSEU NEGATIVE 04/02/2013 0049   HGBUR MODERATE* 04/02/2013 0049   BILIRUBINUR NEGATIVE 04/02/2013 0049   KETONESUR NEGATIVE 04/02/2013 0049   PROTEINUR NEGATIVE 04/02/2013 0049   UROBILINOGEN 0.2 04/02/2013 0049   NITRITE NEGATIVE 04/02/2013 0049   LEUKOCYTESUR MODERATE* 04/02/2013 0049   Few squam, few bacteria on micro.    MAU Course  Procedures   Assessment and Plan  S/p vag  del x 5d Mastitis  Given Keflex 500mg  in MAU and rx for QID x 10d If not feeling better in 12 hrs, call OB [facility]) Tips and instructions given on freq feeding, warm compresses, massage  Shawna Travis 04/02/2013, 1:24 AM

## 2013-04-02 NOTE — MAU Note (Signed)
Pt delivered vaginally on November 26th without any complications other than a 2nd degree tear.  Breastfeeding and developed a fever @ 1700,  Took ibuprofen 600mg  and felt better.  Awoke suddenly with a fever shaking and very anxious @ 2309.  Last pumped breasts at 1900. Breasts firm.

## 2013-04-02 NOTE — MAU Note (Signed)
Vomited several times prior to leaving home.

## 2013-04-04 LAB — URINE CULTURE: Colony Count: >=100000

## 2013-04-05 NOTE — MAU Provider Note (Signed)
Attestation of Attending Supervision of Advanced Practitioner: Evaluation and management procedures were performed by the PA/NP/CNM/OB Fellow under my supervision/collaboration. Chart reviewed and agree with management and plan.  Tilda Burrow 04/05/2013 11:24 AM  Attestation of Attending Supervision of Advanced Practitioner: Evaluation and management procedures were performed by the PA/NP/CNM/OB Fellow under my supervision/collaboration. Chart reviewed and agree with management and plan.  Uriyah Raska V 04/05/2013 11:24 AM

## 2013-05-04 ENCOUNTER — Ambulatory Visit: Payer: Self-pay | Admitting: Nurse Practitioner

## 2013-06-03 ENCOUNTER — Ambulatory Visit: Payer: Self-pay | Admitting: Nurse Practitioner

## 2013-10-31 ENCOUNTER — Emergency Department (HOSPITAL_COMMUNITY): Payer: BC Managed Care – PPO

## 2013-10-31 ENCOUNTER — Encounter (HOSPITAL_COMMUNITY): Payer: Self-pay | Admitting: Emergency Medicine

## 2013-10-31 ENCOUNTER — Emergency Department (HOSPITAL_COMMUNITY)
Admission: EM | Admit: 2013-10-31 | Discharge: 2013-11-01 | Disposition: A | Discharge status: Home / Self Care | Payer: BC Managed Care – PPO | Attending: Emergency Medicine | Admitting: Emergency Medicine

## 2013-10-31 DIAGNOSIS — Z8632 Personal history of gestational diabetes: Secondary | ICD-10-CM | POA: Insufficient documentation

## 2013-10-31 DIAGNOSIS — Z9889 Other specified postprocedural states: Secondary | ICD-10-CM | POA: Insufficient documentation

## 2013-10-31 DIAGNOSIS — R1011 Right upper quadrant pain: Secondary | ICD-10-CM | POA: Insufficient documentation

## 2013-10-31 DIAGNOSIS — Z8742 Personal history of other diseases of the female genital tract: Secondary | ICD-10-CM | POA: Insufficient documentation

## 2013-10-31 DIAGNOSIS — R748 Abnormal levels of other serum enzymes: Secondary | ICD-10-CM | POA: Insufficient documentation

## 2013-10-31 DIAGNOSIS — Z87891 Personal history of nicotine dependence: Secondary | ICD-10-CM | POA: Insufficient documentation

## 2013-10-31 DIAGNOSIS — R1013 Epigastric pain: Secondary | ICD-10-CM | POA: Insufficient documentation

## 2013-10-31 LAB — CBC WITH DIFFERENTIAL/PLATELET
BASOS ABS: 0 10*3/uL (ref 0.0–0.1)
Basophils Relative: 0 % (ref 0–1)
EOS ABS: 0.1 10*3/uL (ref 0.0–0.7)
EOS PCT: 1 % (ref 0–5)
HCT: 40.3 % (ref 36.0–46.0)
Hemoglobin: 14 g/dL (ref 12.0–15.0)
LYMPHS ABS: 2.4 10*3/uL (ref 0.7–4.0)
LYMPHS PCT: 34 % (ref 12–46)
MCH: 30.8 pg (ref 26.0–34.0)
MCHC: 34.7 g/dL (ref 30.0–36.0)
MCV: 88.8 fL (ref 78.0–100.0)
Monocytes Absolute: 0.4 10*3/uL (ref 0.1–1.0)
Monocytes Relative: 6 % (ref 3–12)
NEUTROS PCT: 59 % (ref 43–77)
Neutro Abs: 4.1 10*3/uL (ref 1.7–7.7)
PLATELETS: 253 10*3/uL (ref 150–400)
RBC: 4.54 MIL/uL (ref 3.87–5.11)
RDW: 12 % (ref 11.5–15.5)
WBC: 7.1 10*3/uL (ref 4.0–10.5)

## 2013-10-31 LAB — COMPREHENSIVE METABOLIC PANEL
ALK PHOS: 127 U/L — AB (ref 39–117)
ALT: 714 U/L — AB (ref 0–35)
AST: 778 U/L — ABNORMAL HIGH (ref 0–37)
Albumin: 4.6 g/dL (ref 3.5–5.2)
Anion gap: 18 — ABNORMAL HIGH (ref 5–15)
BUN: 14 mg/dL (ref 6–23)
CO2: 24 meq/L (ref 19–32)
Calcium: 9.6 mg/dL (ref 8.4–10.5)
Chloride: 99 mEq/L (ref 96–112)
Creatinine, Ser: 0.54 mg/dL (ref 0.50–1.10)
GFR calc Af Amer: >90 mL/min (ref >90)
GFR calc non Af Amer: >90 mL/min (ref >90)
GLUCOSE: 120 mg/dL — AB (ref 70–99)
POTASSIUM: 3.9 meq/L (ref 3.7–5.3)
SODIUM: 141 meq/L (ref 137–147)
TOTAL PROTEIN: 7.9 g/dL (ref 6.0–8.3)
Total Bilirubin: 1.4 mg/dL — ABNORMAL HIGH (ref 0.3–1.2)

## 2013-10-31 LAB — URINALYSIS, ROUTINE W REFLEX MICROSCOPIC
Bilirubin Urine: NEGATIVE
Glucose, UA: NEGATIVE mg/dL
Hgb urine dipstick: NEGATIVE
Ketones, ur: NEGATIVE mg/dL
NITRITE: NEGATIVE
PH: 6.5 (ref 5.0–8.0)
Protein, ur: NEGATIVE mg/dL
SPECIFIC GRAVITY, URINE: 1.01 (ref 1.005–1.030)
Urobilinogen, UA: 1 mg/dL (ref 0.0–1.0)

## 2013-10-31 LAB — URINE MICROSCOPIC-ADD ON

## 2013-10-31 LAB — LIPASE, BLOOD: Lipase: 30 U/L (ref 11–59)

## 2013-10-31 LAB — I-STAT TROPONIN, ED: Troponin i, poc: 0 ng/mL (ref 0.00–0.08)

## 2013-10-31 MED ORDER — SODIUM CHLORIDE 0.9 % IV SOLN: INTRAVENOUS | Status: DC

## 2013-10-31 MED ORDER — ONDANSETRON HCL 4 MG/2ML IJ SOLN
4.0000 mg | Freq: Once | INTRAMUSCULAR | Status: AC
  Administered 2013-10-31: 4 mg via INTRAVENOUS
  Filled 2013-10-31: qty 2

## 2013-10-31 MED ORDER — SUCRALFATE 1 G PO TABS: 1.0000 g | ORAL_TABLET | Freq: Four times a day (QID) | ORAL | Status: DC

## 2013-10-31 MED ORDER — SODIUM CHLORIDE 0.9 % IV BOLUS (SEPSIS)
1000.0000 mL | Freq: Once | INTRAVENOUS | Status: AC
  Administered 2013-10-31: 1000 mL via INTRAVENOUS

## 2013-10-31 MED ORDER — HYDROMORPHONE HCL PF 1 MG/ML IJ SOLN
1.0000 mg | Freq: Once | INTRAMUSCULAR | Status: AC
Start: 2013-10-31 — End: 2013-10-31
  Administered 2013-10-31: 1 mg via INTRAVENOUS
  Filled 2013-10-31: qty 1

## 2013-10-31 NOTE — ED Notes (Signed)
Pt aware of the need for a urine sample. 

## 2013-10-31 NOTE — ED Notes (Signed)
Pt ambulated to restroom with steady gait.

## 2013-10-31 NOTE — ED Provider Notes (Signed)
CSN: [id number]     Arrival date & time 10/31/13  2040 History   First MD Initiated Contact with Patient 10/31/13 2107     Chief Complaint  Patient presents with  . Abdominal Pain     (Consider location/radiation/quality/duration/timing/severity/associated sxs/prior Treatment) Patient is a 28 y.o. female presenting with abdominal pain. The history is provided by the patient.  Abdominal Pain  patient here complaining of right upper quadrant and epigastric pain x2 days. States that she feels better she burps. Symptoms are worse with eating. No prior history of same. Has not taken any medications for this. Denies any fever or chills. She's had nausea without vomiting. No black or bloody stools. Symptoms persisted. Denies any vaginal bleeding or discharge. Denies any urinary symptoms. No anginal type symptoms  Past Medical History  Diagnosis Date  . Ovarian cyst     2011  . Gestational diabetes    Past Surgical History  Procedure Laterality Date  . Cyst on ovary    . Ovarian cyst removal  2011  . Wisdom tooth extraction     Family History  Problem Relation Age of Onset  . Hypertension Father   . Hyperlipidemia Father   . Heart disease Sister   . Heart disease Maternal Grandmother    History  Substance Use Topics  . Smoking status: Former Smoker -- 15.00 packs/day    Types: Cigarettes    Quit date: 07/23/2012  . Smokeless tobacco: Never Used  . Alcohol Use: No   OB History   Grav Para Term Preterm Abortions TAB SAB Ect Mult Living   2    1  1   1      Review of Systems  Gastrointestinal: Positive for abdominal pain.  All other systems reviewed and are negative.     Allergies  Sulfa antibiotics  Home Medications   Prior to Admission medications   Medication Sig Start Date End Date Taking? Authorizing Provider  HYDROcodone-acetaminophen (NORCO/VICODIN) 5-325 MG per tablet Take 1 tablet by mouth every 6 (six) hours as needed for moderate pain.    Historical Provider,  MD  ibuprofen (ADVIL,MOTRIN) 600 MG tablet Take 600 mg by mouth every 6 (six) hours as needed.    Historical Provider, MD  Prenatal Vit-Fe Fumarate-FA (PRENATAL MULTIVITAMIN) TABS tablet Take 1 tablet by mouth at bedtime.    Historical Provider, MD  zolpidem (AMBIEN) 10 MG tablet Take 10 mg by mouth at bedtime as needed for sleep.    Historical Provider, MD   BP 131/78  Pulse 60  Temp(Src) 97.9 F (36.6 C) (Oral)  Resp 18  Ht 5\' 4"  (1.626 m)  Wt 194 lb (87.998 kg)  BMI 33.28 kg/m2  SpO2 100% Physical Exam  Nursing note and vitals reviewed. Constitutional: She is oriented to person, place, and time. She appears well-developed and well-nourished.  Non-toxic appearance. No distress.  HENT:  Head: Normocephalic and atraumatic.  Eyes: Conjunctivae, EOM and lids are normal. Pupils are equal, round, and reactive to light.  Neck: Normal range of motion. Neck supple. No tracheal deviation present. No mass present.  Cardiovascular: Normal rate, regular rhythm and normal heart sounds.  Exam reveals no gallop.   No murmur heard. Pulmonary/Chest: Effort normal and breath sounds normal. No stridor. No respiratory distress. She has no decreased breath sounds. She has no wheezes. She has no rhonchi. She has no rales.  Abdominal: Soft. Normal appearance and bowel sounds are normal. She exhibits no distension. There is tenderness in the right upper  quadrant and epigastric area. There is guarding. There is no rebound and no CVA tenderness.    Musculoskeletal: Normal range of motion. She exhibits no edema and no tenderness.  Neurological: She is alert and oriented to person, place, and time. She has normal strength. No cranial nerve deficit or sensory deficit. GCS eye subscore is 4. GCS verbal subscore is 5. GCS motor subscore is 6.  Skin: Skin is warm and dry. No abrasion and no rash noted.  Psychiatric: She has a normal mood and affect. Her speech is normal and behavior is normal.    ED Course    Procedures (including critical care time) Labs Review Labs Reviewed  CBC WITH DIFFERENTIAL  COMPREHENSIVE METABOLIC PANEL  LIPASE, BLOOD  URINALYSIS, ROUTINE W REFLEX MICROSCOPIC  I-STAT TROPOININ, ED    Imaging Review No results found.   EKG Interpretation   Date/Time:  Wednesday October 31 2013 21:11:05 EDT Ventricular Rate:  64 PR Interval:  160 QRS Duration: 85 QT Interval:  432 QTC Calculation: 446 R Axis:   65 Text Interpretation:  Sinus rhythm Confirmed by Freida BusmanALLEN  MD, Issaac Shipper ([id number])  on 10/31/2013 9:29:10 PM      MDM   Final diagnoses:  None    Patient given pain meds here and she feels better. Spoke with the gastroenterologist on call he will see her tomorrow in followup. No evidence of surgical process at this time.    Toy BakerAnthony T Concepcion Gillott, MD 10/31/13 [phone]

## 2013-10-31 NOTE — ED Notes (Signed)
Pt states that this morning she felt like she was having indigestion. Pain decreased throughout the day but returned even worse after she ate tonight.

## 2013-10-31 NOTE — Discharge Instructions (Signed)
Followup with the gastroenterologist tomorrow Abdominal Pain Many things can cause abdominal pain. Usually, abdominal pain is not caused by a disease and will improve without treatment. It can often be observed and treated at home. Your health care provider will do a physical exam and possibly order blood tests and X-rays to help determine the seriousness of your pain. However, in many cases, more time must pass before a clear cause of the pain can be found. Before that point, your health care provider may not know if you need more testing or further treatment. HOME CARE INSTRUCTIONS  Monitor your abdominal pain for any changes. The following actions may help to alleviate any discomfort you are experiencing:  Only take over-the-counter or prescription medicines as directed by your health care provider.  Do not take laxatives unless directed to do so by your health care provider.  Try a clear liquid diet (broth, tea, or water) as directed by your health care provider. Slowly move to a bland diet as tolerated. SEEK MEDICAL CARE IF:  You have unexplained abdominal pain.  You have abdominal pain associated with nausea or diarrhea.  You have pain when you urinate or have a bowel movement.  You experience abdominal pain that wakes you in the night.  You have abdominal pain that is worsened or improved by eating food.  You have abdominal pain that is worsened with eating fatty foods.  You have a fever. SEEK IMMEDIATE MEDICAL CARE IF:   Your pain does not go away within 2 hours.  You keep throwing up (vomiting).  Your pain is felt only in portions of the abdomen, such as the right side or the left lower portion of the abdomen.  You pass bloody or black tarry stools. MAKE SURE YOU:  Understand these instructions.   Will watch your condition.   Will get help right away if you are not doing well or get worse.  Document Released: 01/27/2005 Document Revised: 04/24/2013 Document  Reviewed: 12/27/2012 [facility] Patient Information 2015 [employer] [place], [place]. This information is not intended to replace advice given to you by your health care provider. Make sure you discuss any questions you have with your health care provider.

## 2013-11-01 LAB — GAMMA GT: GGT: 560 U/L — ABNORMAL HIGH (ref 7–51)

## 2013-11-01 LAB — HEPATITIS PANEL, ACUTE
HCV Ab: NEGATIVE
HEP A IGM: NONREACTIVE
Hep B C IgM: NONREACTIVE
Hepatitis B Surface Ag: NEGATIVE

## 2013-11-02 ENCOUNTER — Ambulatory Visit (INDEPENDENT_AMBULATORY_CARE_PROVIDER_SITE_OTHER): Payer: BC Managed Care – PPO | Admitting: Family Medicine

## 2013-11-02 VITALS — BP 111/73 | HR 71 | Temp 98.0°F | Resp 16 | Ht 63.25 in | Wt 195.0 lb

## 2013-11-02 DIAGNOSIS — R945 Abnormal results of liver function studies: Principal | ICD-10-CM

## 2013-11-02 DIAGNOSIS — R1013 Epigastric pain: Secondary | ICD-10-CM

## 2013-11-02 DIAGNOSIS — R7989 Other specified abnormal findings of blood chemistry: Secondary | ICD-10-CM

## 2013-11-02 LAB — COMPREHENSIVE METABOLIC PANEL
ALT: 714 U/L — AB (ref 0–35)
AST: 193 U/L — ABNORMAL HIGH (ref 0–37)
Albumin: 4.2 g/dL (ref 3.5–5.2)
Alkaline Phosphatase: 117 U/L (ref 39–117)
BUN: 12 mg/dL (ref 6–23)
CALCIUM: 9.3 mg/dL (ref 8.4–10.5)
CHLORIDE: 106 meq/L (ref 96–112)
CO2: 28 mEq/L (ref 19–32)
CREATININE: 0.66 mg/dL (ref 0.50–1.10)
GLUCOSE: 123 mg/dL — AB (ref 70–99)
Potassium: 4 mEq/L (ref 3.5–5.3)
Sodium: 142 mEq/L (ref 135–145)
Total Bilirubin: 0.5 mg/dL (ref 0.2–1.2)
Total Protein: 6.8 g/dL (ref 6.0–8.3)

## 2013-11-02 LAB — CBC
HEMATOCRIT: 38.9 % (ref 36.0–46.0)
HEMOGLOBIN: 13.7 g/dL (ref 12.0–15.0)
MCH: 31.1 pg (ref 26.0–34.0)
MCHC: 35.2 g/dL (ref 30.0–36.0)
MCV: 88.4 fL (ref 78.0–100.0)
PLATELETS: 243 10*3/uL (ref 150–400)
RBC: 4.4 MIL/uL (ref 3.87–5.11)
RDW: 13 % (ref 11.5–15.5)
WBC: 5.9 10*3/uL (ref 4.0–10.5)

## 2013-11-02 NOTE — Progress Notes (Signed)
Patient ID: Shawna Travis, female   DOB: Sep 07, 1985, 28 y.o.   MRN: [id number] This chart was scribed for Ethelda ChickKristi M Smith, MD by Modena JanskyAlbert Thayil, ED Scribe. This patient was seen in room 5 and the patient's care was started at 11:30 AM.   Subjective:    Patient ID: Shawna Travis, female    DOB: Sep 07, 1985, 28 y.o.   MRN: [id number]  11/02/2013  Elevated Hepatic Enzymes   HPI HPI Comments: Shawna CharlestonDenea Y Keagle is a 28 y.o. female who presents to the [facility] complaining of a need for a liver function test as a follow up to a recent ED visit on 10/31/13. Her liver function in the ED on 10/31/13 was elevated in the 700 range.   She reports that she recently got a new bed and her hips began to hurt. She states that she started taking Tylenol ES two tablets twice a day. Then she states that she began to feel extremely severe epigastric and RUQ pain and went to the ED. She stated that the provider at the ED said her liver tests were elevated possibly due to an overdose on Tylenol.  S/p abdominal u/s in the ED that was negative.  S/p acute hepatitis panel in ED negative.   The ED referred her to GI on 7/2.  At the gastroenterologist yesterday, her LFTs had worsened and were up to 1000.  Dr. Bosie ClosSchooler recommended presenting to Urgent Care today for repeat testing. If LFTs are even worse, advised to present for admission.   She states that she has had chills. She reports that she has been breast feeding.   Denies n/v/d/c.  Denies jaundice.  She reports that she does not smoke. She also reports no drug or alcohol use. She denies taking any hydrocodone.  She denies any fever, diaphoresis, nausea, emesis, or fatigue. She also denies recently traveling. She denies any other new medications aside from prenatal vitamins.   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     No PCP  Review of Systems  Constitutional: Positive for chills. Negative for fever, diaphoresis, activity change, appetite change and  fatigue.  Gastrointestinal: Positive for abdominal pain. Negative for nausea, vomiting, diarrhea, constipation, blood in stool, abdominal distention and rectal pain.  Skin: Negative for rash.    Past Medical History  Diagnosis Date   Ovarian cyst     2011   Gestational diabetes    Allergies  Allergen Reactions   Sulfa Antibiotics Other (See Comments)    Red Blotches   Current Outpatient Prescriptions  Medication Sig Dispense Refill   Prenatal Vit-Fe Fumarate-FA (PRENATAL MULTIVITAMIN) TABS tablet Take 1 tablet by mouth at bedtime.       No current facility-administered medications for this visit.   History   Social History   Marital Status: Married    Spouse Name: N/A    Number of Children: N/A   Years of Education: N/A   Occupational History   Not on file.   Social History Main Topics   Smoking status: Former Smoker -- 15.00 packs/day    Types: Cigarettes    Quit date: 07/23/2012   Smokeless tobacco: Never Used   Alcohol Use: No   Drug Use: 1.00 per week    Special: Marijuana     Comment: prior to pregnancy   Sexual Activity: Yes   Other Topics Concern   Not on file   Social History Narrative   No narrative on file  Family History  Problem Relation Age of Onset   Hypertension Father    Hyperlipidemia Father    Heart disease Sister    Heart disease Maternal Grandmother        Objective:    BP 111/73   Pulse 71   Temp(Src) 98 F (36.7 C) (Oral)   Resp 16   Ht 5' 3.25" (1.607 m)   Wt 195 lb (88.451 kg)   BMI 34.25 kg/m2   SpO2 99% Physical Exam  Nursing note and vitals reviewed. Constitutional: She is oriented to person, place, and time. She appears well-developed and well-nourished. No distress.  HENT:  Head: Normocephalic and atraumatic.  Mouth/Throat: Oropharynx is clear and moist.  Eyes: Conjunctivae are normal. Pupils are equal, round, and reactive to light.  Neck: Neck supple. No tracheal deviation present. No thyromegaly  present.  Cardiovascular: Normal rate, regular rhythm, normal heart sounds and intact distal pulses.   No murmur heard. Pulmonary/Chest: Effort normal and breath sounds normal. No respiratory distress.  Abdominal: Soft. Bowel sounds are normal. She exhibits no distension and no mass. There is no hepatosplenomegaly. There is tenderness. There is no rebound and no guarding.  Mild epigastric tenderness  Musculoskeletal: Normal range of motion.  Lymphadenopathy:    She has no cervical adenopathy.  Neurological: She is alert and oriented to person, place, and time.  Skin: Skin is warm and dry.  Psychiatric: She has a normal mood and affect. Her behavior is normal.   Results for orders placed in visit on 11/02/13  CBC      Result Value Ref Range   WBC 5.9  4.0 - 10.5 K/uL   RBC 4.40  3.87 - 5.11 MIL/uL   Hemoglobin 13.7  12.0 - 15.0 g/dL   HCT 02.738.9  25.336.0 - 66.446.0 %   MCV 88.4  78.0 - 100.0 fL   MCH 31.1  26.0 - 34.0 pg   MCHC 35.2  30.0 - 36.0 g/dL   RDW 40.313.0  47.411.5 - 25.915.5 %   Platelets 243  150 - 400 K/uL  COMPREHENSIVE METABOLIC PANEL      Result Value Ref Range   Sodium 142  135 - 145 mEq/L   Potassium 4.0  3.5 - 5.3 mEq/L   Chloride 106  96 - 112 mEq/L   CO2 28  19 - 32 mEq/L   Glucose, Bld 123 (*) 70 - 99 mg/dL   BUN 12  6 - 23 mg/dL   Creat 5.630.66  8.750.50 - 6.431.10 mg/dL   Total Bilirubin 0.5  0.2 - 1.2 mg/dL   Alkaline Phosphatase 117  39 - 117 U/L   AST 193 (*) 0 - 37 U/L   ALT 714 (*) 0 - 35 U/L   Total Protein 6.8  6.0 - 8.3 g/dL   Albumin 4.2  3.5 - 5.2 g/dL   Calcium 9.3  8.4 - 32.910.5 mg/dL       Assessment & Plan:  Elevated liver function tests - Plan: CBC, Comprehensive metabolic panel, Comprehensive metabolic panel  Abdominal pain, epigastric   1. Elevated LFTs:  Worsening; new to this provider.  STAT labs with improving ALT at 714.  Recommend repeat labs in 24-48 hours at office.  Patient advised of results; no office visit warranted in upcoming 24 hours unless  symptomatically declines. 2. Abdominal pain epigastric and RUQ: improving and nearly resolved.    No orders of the defined types were placed in this encounter.    No Follow-up on  file.  I personally performed the services described in this documentation, which was scribed in my presence.  The recorded information has been reviewed and is accurate.  Nilda Simmer, M.D.  [facility] [address] [place], [place]  [address] [phone] phone [phone] fax

## 2013-11-03 ENCOUNTER — Other Ambulatory Visit (INDEPENDENT_AMBULATORY_CARE_PROVIDER_SITE_OTHER): Payer: BC Managed Care – PPO | Admitting: *Deleted

## 2013-11-03 DIAGNOSIS — R945 Abnormal results of liver function studies: Principal | ICD-10-CM

## 2013-11-03 DIAGNOSIS — R7989 Other specified abnormal findings of blood chemistry: Secondary | ICD-10-CM

## 2013-11-03 LAB — COMPREHENSIVE METABOLIC PANEL
ALBUMIN: 4.2 g/dL (ref 3.5–5.2)
ALK PHOS: 100 U/L (ref 39–117)
ALT: 472 U/L — AB (ref 0–35)
AST: 74 U/L — ABNORMAL HIGH (ref 0–37)
BILIRUBIN TOTAL: 0.5 mg/dL (ref 0.2–1.2)
BUN: 12 mg/dL (ref 6–23)
CO2: 29 mEq/L (ref 19–32)
Calcium: 9.3 mg/dL (ref 8.4–10.5)
Chloride: 106 mEq/L (ref 96–112)
Creat: 0.62 mg/dL (ref 0.50–1.10)
Glucose, Bld: 106 mg/dL — ABNORMAL HIGH (ref 70–99)
POTASSIUM: 4.5 meq/L (ref 3.5–5.3)
Sodium: 142 mEq/L (ref 135–145)
Total Protein: 6.3 g/dL (ref 6.0–8.3)

## 2013-12-04 IMAGING — US US OB COMP LESS 14 WK
1 series · 14 of 28 positions shown · non-contrast
Comparison: None.

CLINICAL DATA: Pelvic pain.  Positive pregnancy test.

OBSTETRIC <14 WK US AND TRANSVAGINAL OB US
TECHNIQUE: Both transabdominal and transvaginal ultrasound
examinations were performed for complete evaluation of the
gestation as well as the maternal uterus, adnexal regions, and
pelvic cul-de-sac.  Transvaginal technique was performed to assess
early pregnancy.

[Series 1: us ob comp less 14 wk · 0.30mm/px · 14 of 86 slices shown]
[im 4/86]
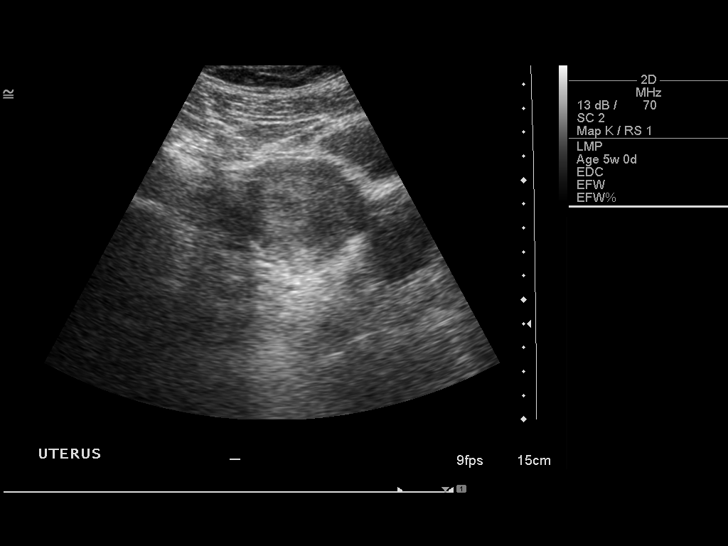
[im 10/86]
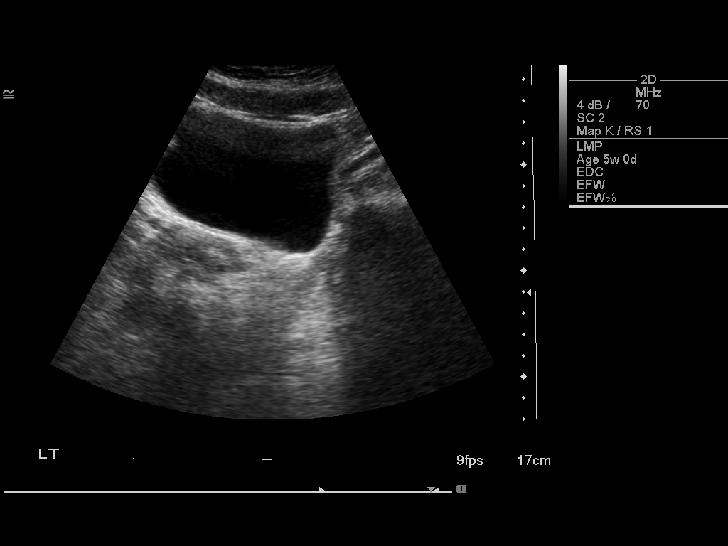
[im 16/86]
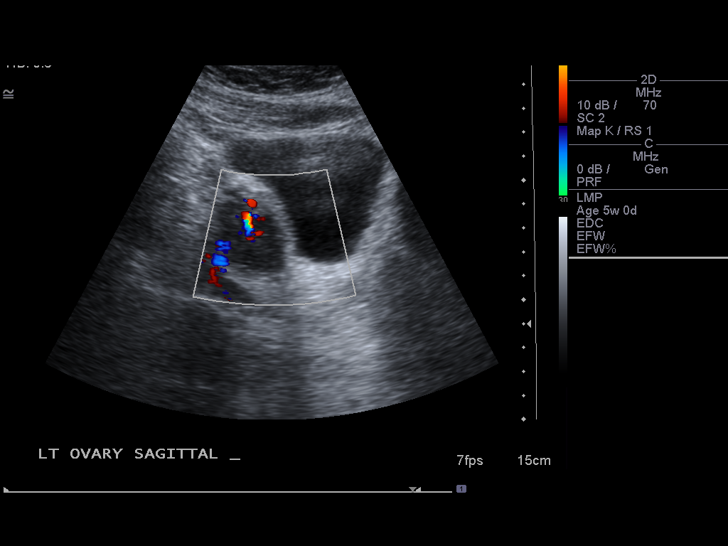
[im 23/86]
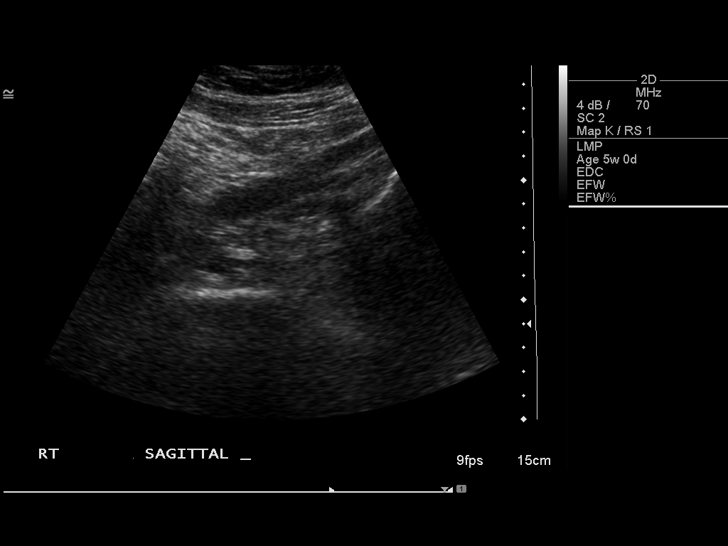
[im 29/86]
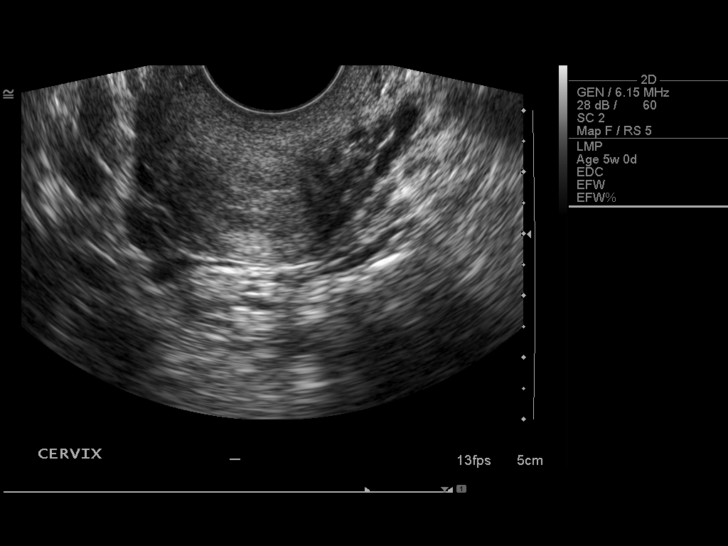
[im 35/86]
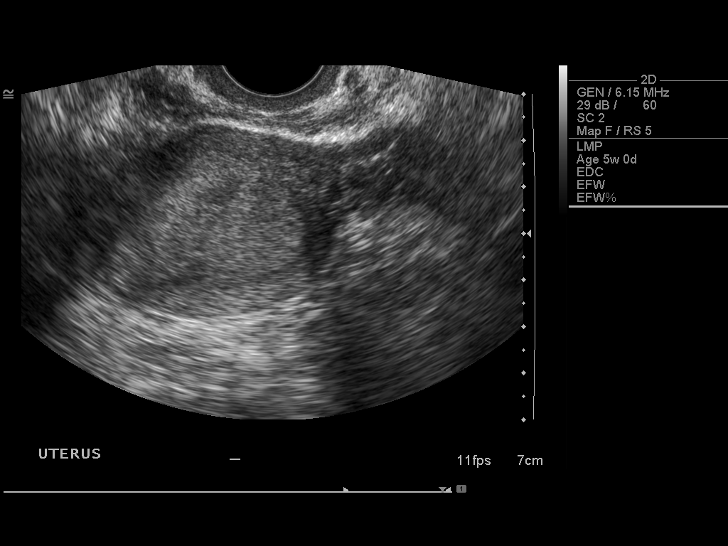
[im 41/86]
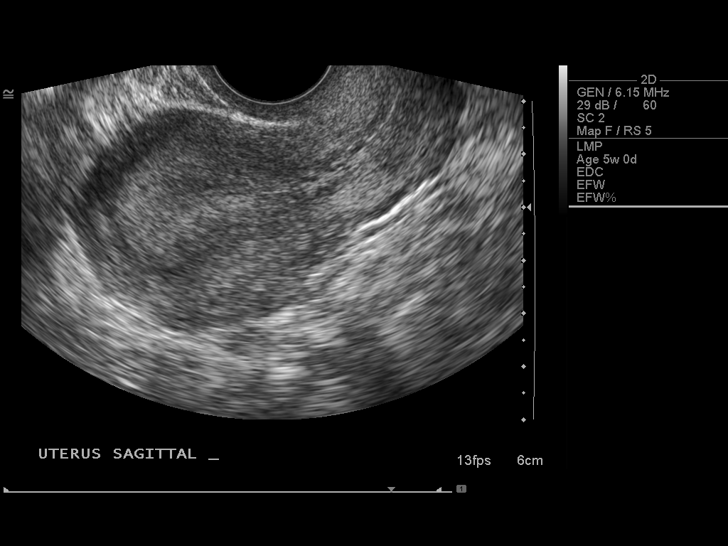
[im 48/86]
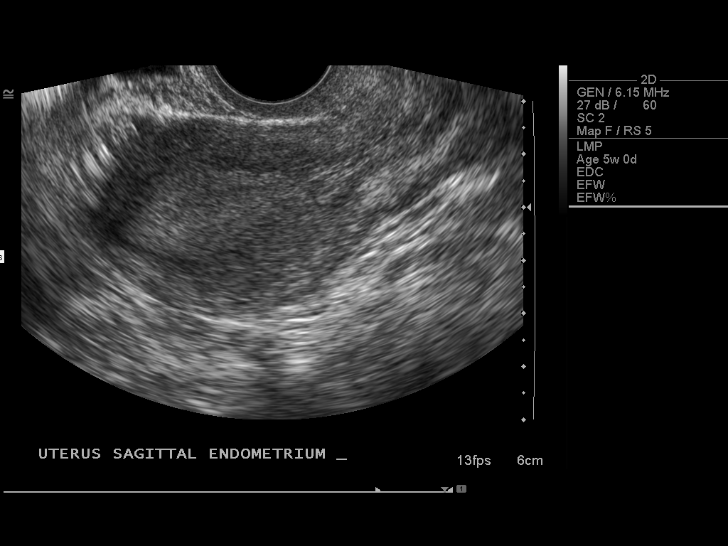
[im 54/86]
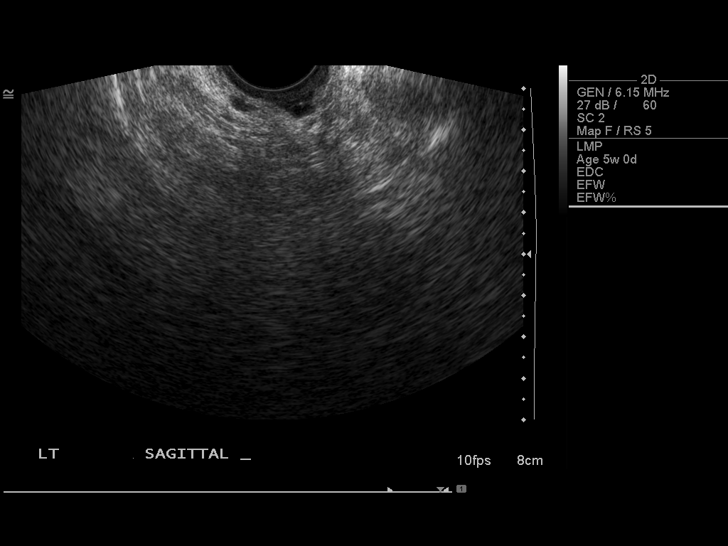
[im 60/86]
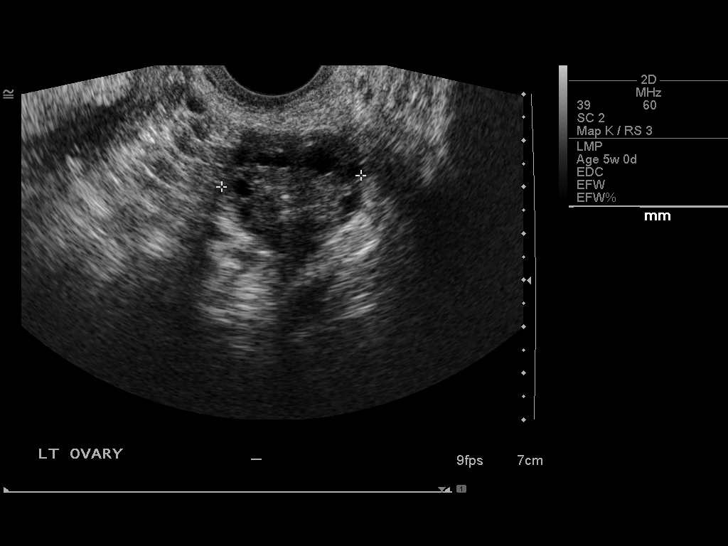
[im 67/86]
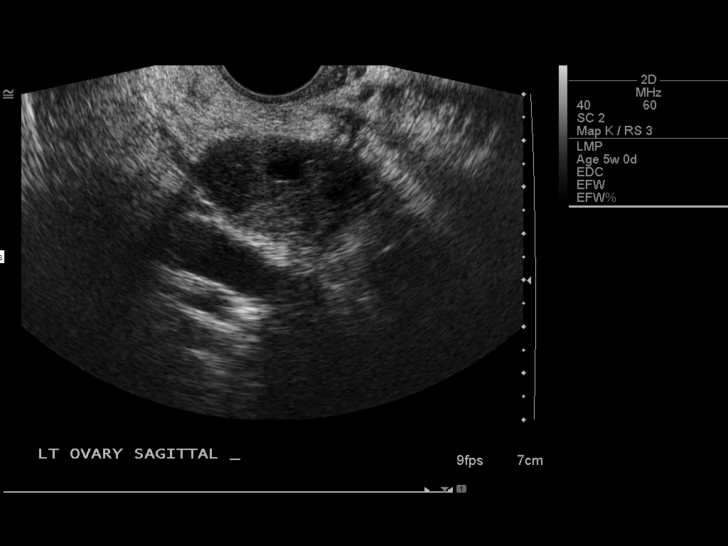
[im 73/86]
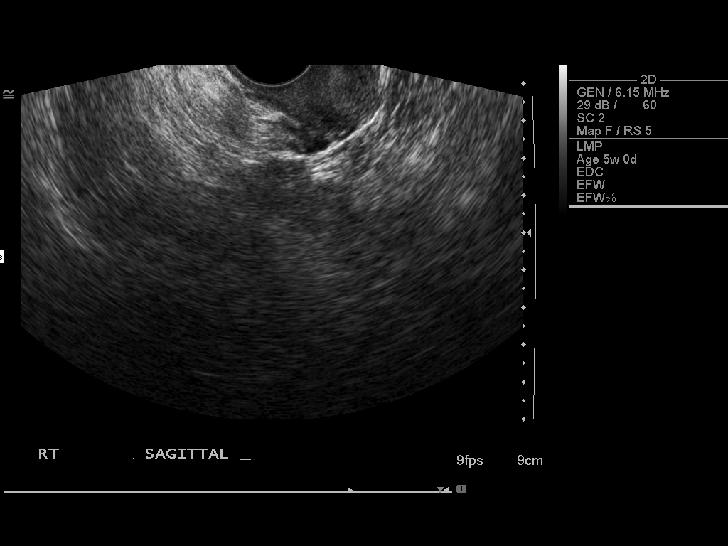
[im 79/86]
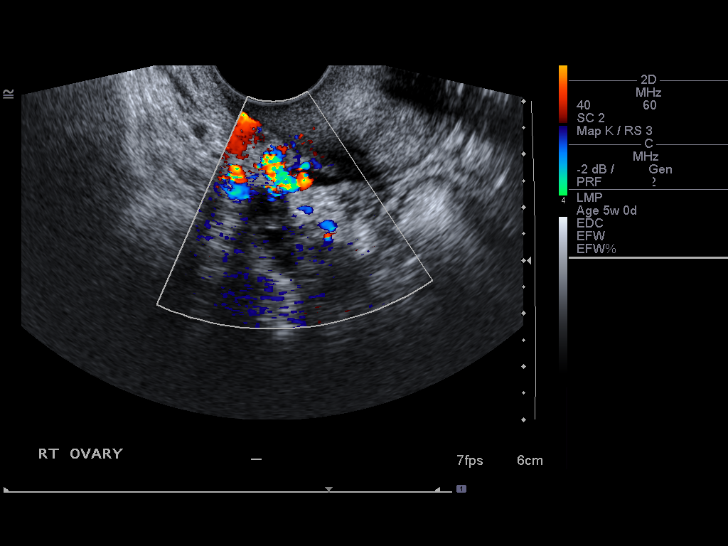
[im 86/86]
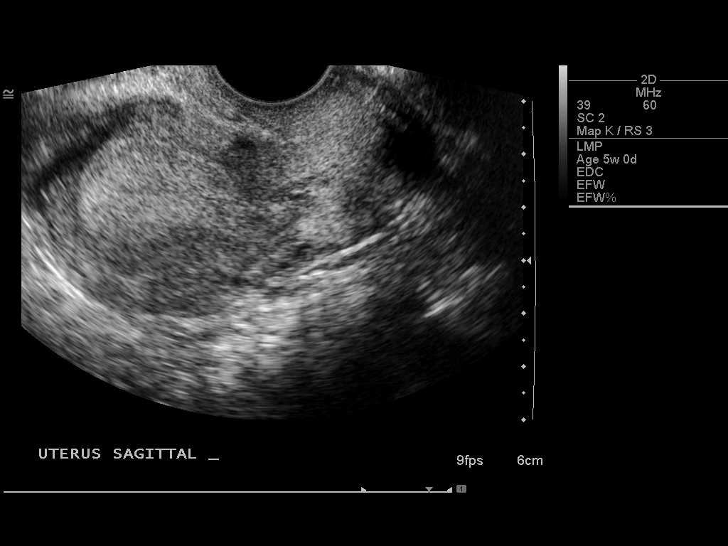

[14 of 28 positions shown; findings below may reference images not displayed]

Intrauterine gestational sac:  None
Yolk sac: None
Embryo: None
Cardiac Activity: N/A
Heart Rate: N/A bpm

Maternal uterus/adnexae:
Normal right ovary.
Normal left ovary.
Thickened endometrium measuring 21 mm.
IMPRESSION: 1.  Thickened endometrium but no intrauterine gestational sac.
2.  Normal ovaries.

## 2014-09-10 NOTE — H&P (Signed)
L&D Evaluation:  History:  HPI 29 yo G1P1001 at 8843w0d by EDC of 03/26/2013, pregnancy complicated by diet controlled GDM, who presents with back pain.  She denies dysuria, fevers, or chills.  Notes some mild irregular contractions.  +FM, no VB.  Has noted some more vaginal discharge in the past week.   Presents with contractions   Patient's Medical History No Chronic Illness   Patient's Surgical History none   Medications Pre Natal Vitamins   Allergies NKDA   Social History none   Family History Non-Contributory   ROS:  ROS All systems were reviewed.  HEENT, CNS, GI, GU, Respiratory, CV, Renal and Musculoskeletal systems were found to be normal.   Exam:  Vital Signs stable   General no apparent distress   Mental Status clear   Chest no increased work of breathing   Abdomen gravid, non-tender   Estimated Fetal Weight Average for gestational age   Fetal Position vtx   Back no CVAT, reproducible paraspinal muscle tenderness   Edema no edema   Pelvic no external lesions, 3.5-4/50/-2   Mebranes Intact, nitrazine negative   FHT normal rate with no decels   Ucx regular, q5-486min   Impression:  Impression Braxton Hick contractions and MSK lumbago   Plan:  Comments 1) Braxton Hicks contractions - no change over 2-hrs, given routine labor precautions 2) Lumbago - discussed flexeril vs ambien.  Given Rx ambien 10mg  tab po qHS prn insomnia 3) Fetus - reactive NST, category I tacing, incidentally negative contraction stress test 4) Disposition - discharge home with HROB follow up on 03/15/13   Follow Up Appointment already scheduled   Electronic Signatures: Lorrene ReidStaebler, Savanah Bayles M (MD)  (Signed [id number]-Nov-14 04:19)  Authored: L&D Evaluation   Last Updated: 10-Nov-14 04:19 by Lorrene ReidStaebler, Jovonni Borquez M (MD)

## 2014-09-10 NOTE — H&P (Signed)
L&D Evaluation:  History:  HPI 29 yo G1P1001 at 4823w2d by EDC of 03/26/2013, pregnancy complicated by diet controlled GDM, who presents with back pain.  She denies dysuria, fevers, or chills.  Notes some mild irregular contractions.  +FM, no VB, no LOF   Presents with contractions   Patient's Medical History No Chronic Illness   Patient's Surgical History none   Medications Pre Natal Vitamins   Allergies NKDA   Social History none   Family History Non-Contributory   ROS:  ROS All systems were reviewed.  HEENT, CNS, GI, GU, Respiratory, CV, Renal and Musculoskeletal systems were found to be normal.   Exam:  Vital Signs stable   General no apparent distress   Mental Status clear   Chest no increased work of breathing   Abdomen gravid, non-tender   Estimated Fetal Weight Average for gestational age   Fetal Position vtx   Back no CVAT   Edema no edema   Pelvic no external lesions, 7cm per nursing staff   Mebranes Intact, nitrazine negative   FHT normal rate with no decels, category I tracing   Ucx regular, q685min   Impression:  Impression active labor   Plan:  Plan EFM/NST, monitor contractions and for cervical change   Comments 1) Admit for term labor - 7cm per nursing staff, was noted to be 5cm on last check in clinic 2) PNL O negative / ABSC neg / RI / VZI / HIV neg / HBsAG neg / RPR NR / GBS negative (03/01/13)     - s/p rhogam 01/04/2013 3) Fetus - reactive NST, category I tacing, incidentally negative contraction stress test     - growth scan 5lbs 1oz on 02/01/13 68%ile of 32 weeks     - growth scan 7lbs 8oz on 03/01/13 81%ile for 36 weeks but AC >97%ile (3395g)     - projected weight ~9lbs 8oz based on 36 week scan 4) GDM - diet controlled, check BG on admission 5) Immuniztions - TDAP 01/17/2013, influenza 02/20/2013 6) Disposition - discharge home with HROB follow up on 03/15/13    Electronic Signatures: Lorrene ReidStaebler, Kaniesha Barile M (MD)  (Signed [phone]-Nov-14  01:38)  Authored: L&D Evaluation   Last Updated: 26-Nov-14 01:38 by Lorrene ReidStaebler, Haward Pope M (MD)

## 2015-03-13 IMAGING — US US ABDOMEN COMPLETE
1 series · 14 of 25 positions shown · non-contrast
Comparison: None.

CLINICAL DATA: Right upper quadrant pain

EXAM:
ULTRASOUND ABDOMEN COMPLETE

[Series 1: us abdomen complete · 0.22mm/px · 14 of 69 slices shown]
[im 1/69]
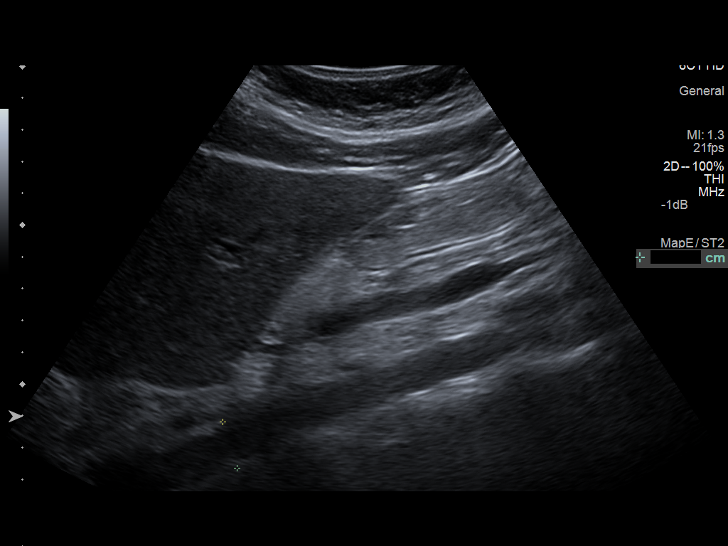
[im 6/69]
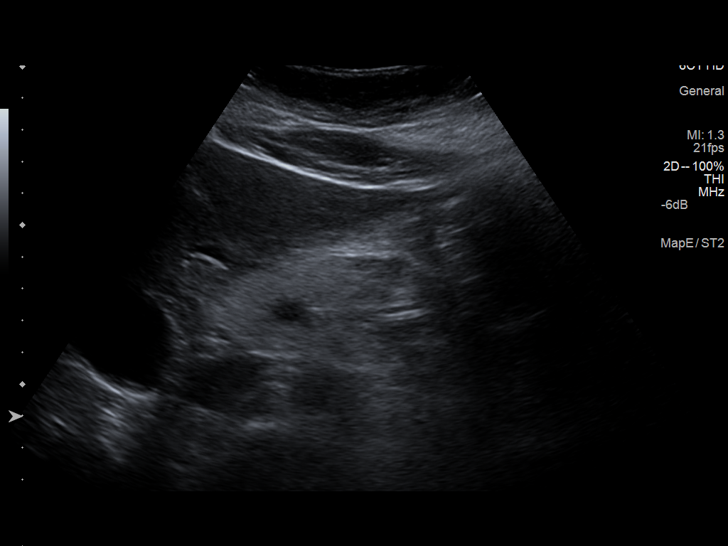
[im 12/69]
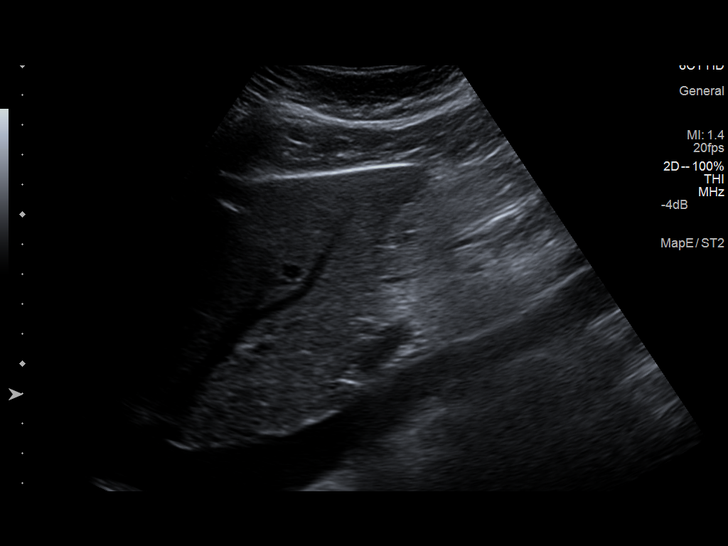
[im 18/69]
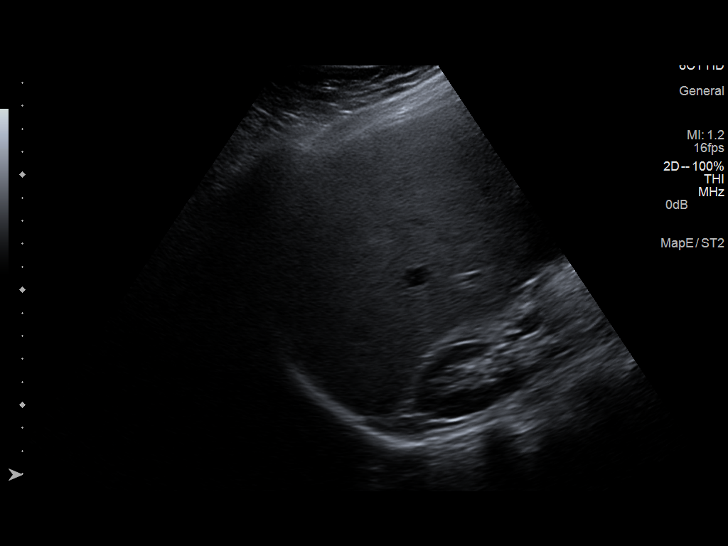
[im 23/69]
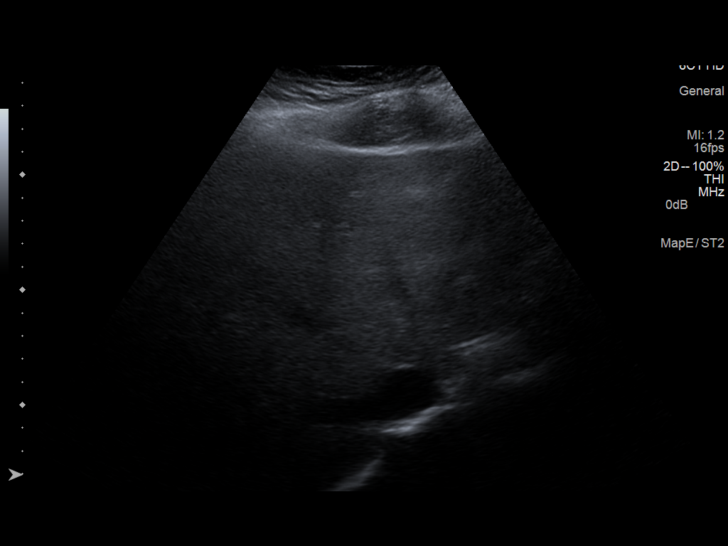
[im 26/69]
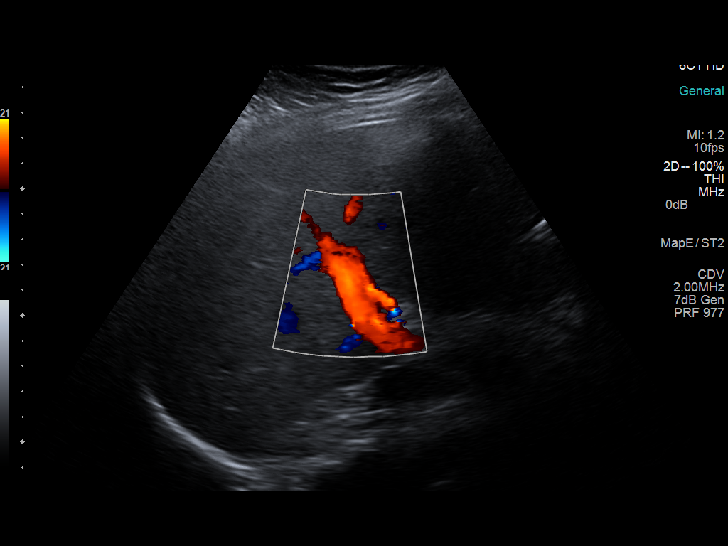
[im 32/69]
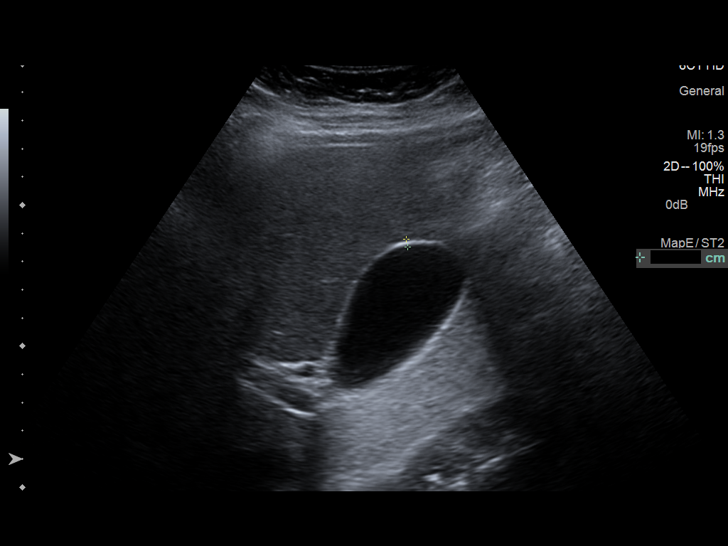
[im 37/69]
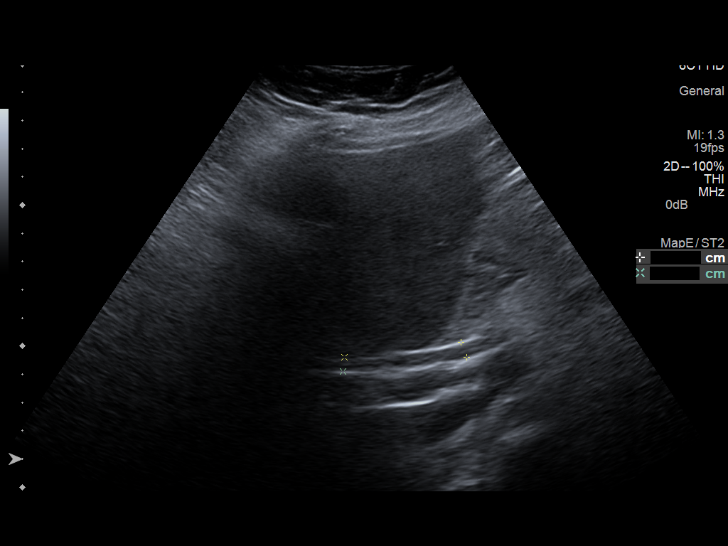
[im 43/69]
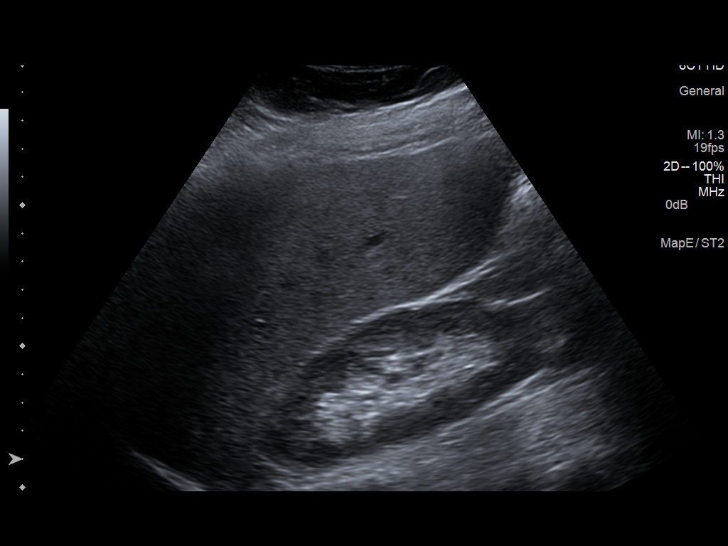
[im 46/69]
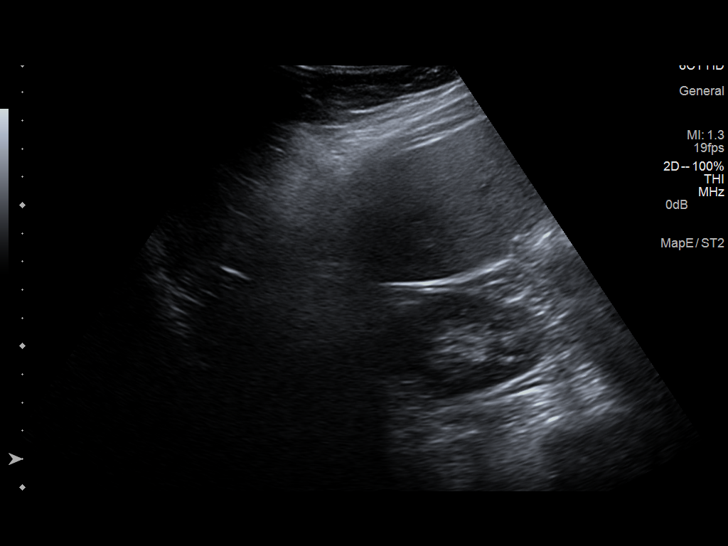
[im 52/69]
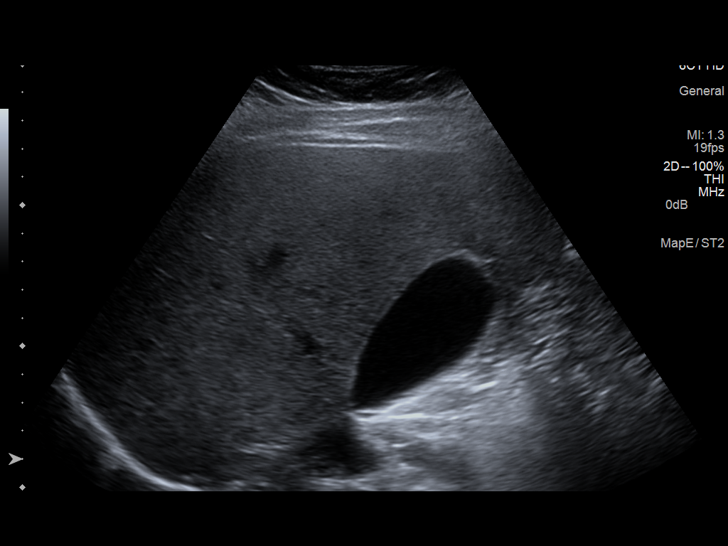
[im 57/69]
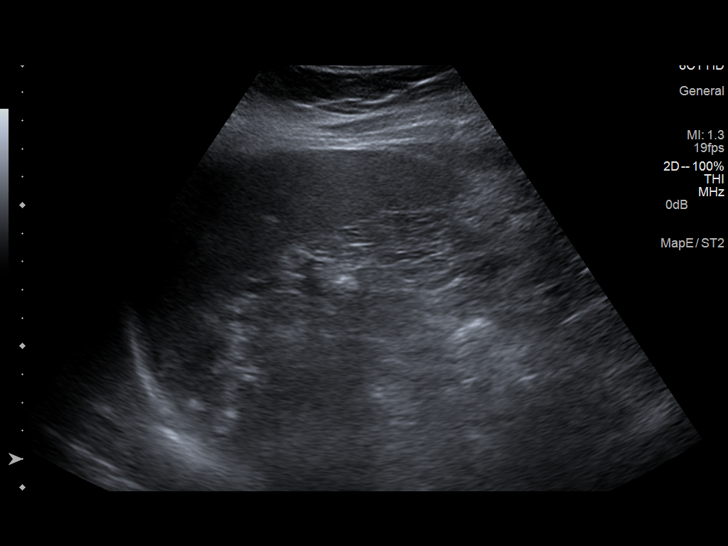
[im 63/69]
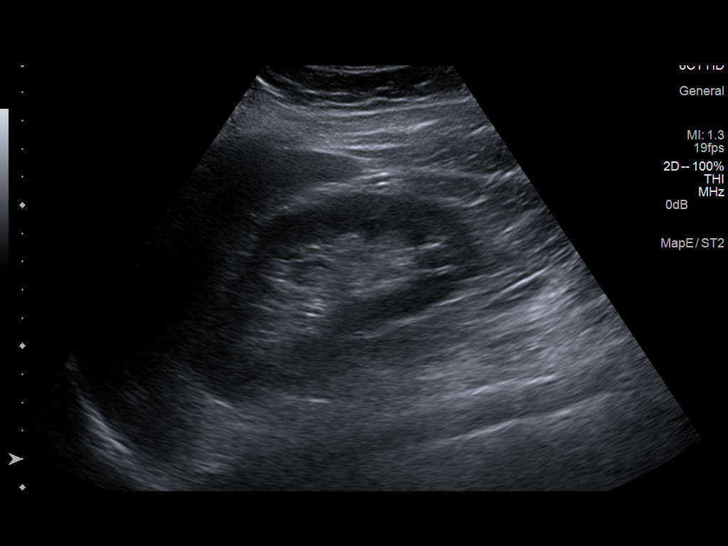
[im 69/69]
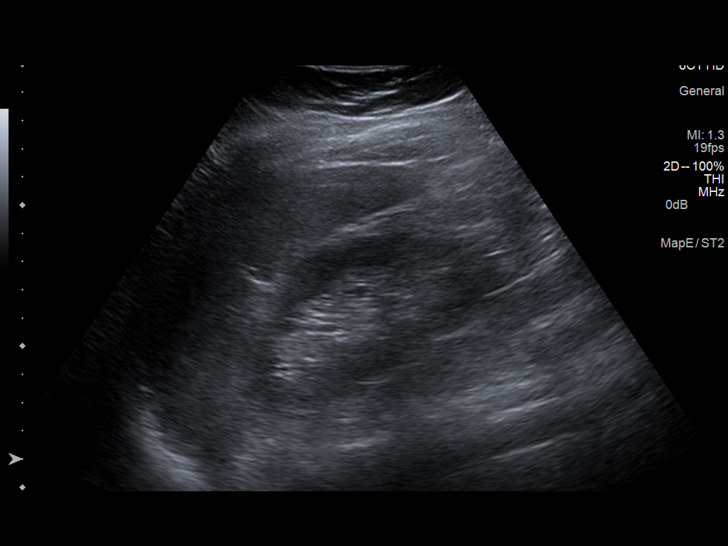

[14 of 25 positions shown; findings below may reference images not displayed]

FINDINGS: Gallbladder:

No gallstones or wall thickening visualized. No sonographic Murphy
sign noted.

Common bile duct:

Diameter: 5 mm

Liver:

No focal lesion identified. Within normal limits in parenchymal
echogenicity.

IVC:

No abnormality visualized.

Pancreas:

Visualized portion unremarkable.

Spleen:

Size and appearance within normal limits.

Right Kidney:

Length: 11.3 cm. Echogenicity within normal limits. No mass or
hydronephrosis visualized.

Left Kidney:

Length: 11.3 cm. Echogenicity within normal limits. No mass or
hydronephrosis visualized.

Abdominal aorta:

No aneurysm visualized.

Other findings:

None.
IMPRESSION: 1. Normal abdominal sonogram.

## 2016-05-03 NOTE — L&D Delivery Note (Signed)
Delivery Note At 1:44 PM a viable female was delivered via Vaginal, Spontaneous Delivery (Presentation: OA).  APGAR: 9, 9; weight pending .   Placenta status: delivered, spontaneously, intact.  Cord: 3VC with the following complications: .  Cord pH: n/a  Anesthesia:  epidural Episiotomy: None Lacerations: 1st degree;Vaginal Suture Repair: 3.0 vicryl Est. Blood Loss (mL): 350  Mom to postpartum.  Baby to Couplet care / Skin to Skin.  Called to see patient.  Mom pushed to deliver a viable female infant.  The head followed by shoulders, which delivered without difficulty, and the rest of the body.  A single, tight nuchal cord noted and delivered through and reduced at the perineum after the delivery of the body.  Baby to mom's chest.  Cord clamped and cut after > 1 min delay.  Cord blood obtained.  Placenta delivered spontaneously, intact, with a 3-vessel cord.  First degree vaginal laceration repaired with 3-0 Vicryl in standard fashion.  All counts correct.  Hemostasis obtained with IV pitocin and fundal massage. EBL 350 mL.     Thomasene MohairStephen Gunnard Dorrance 12/29/2016, 2:02 PM

## 2016-05-06 LAB — HM PAP SMEAR

## 2016-05-06 LAB — OB RESULTS CONSOLE GC/CHLAMYDIA
CHLAMYDIA, DNA PROBE: NEGATIVE
GC PROBE AMP, GENITAL: NEGATIVE

## 2016-05-13 LAB — OB RESULTS CONSOLE ABO/RH: RH TYPE: NEGATIVE

## 2016-05-13 LAB — OB RESULTS CONSOLE RUBELLA ANTIBODY, IGM: Rubella: IMMUNE

## 2016-05-13 LAB — OB RESULTS CONSOLE HEPATITIS B SURFACE ANTIGEN: Hepatitis B Surface Ag: NEGATIVE

## 2016-05-13 LAB — OB RESULTS CONSOLE HIV ANTIBODY (ROUTINE TESTING): HIV: NONREACTIVE

## 2016-05-13 LAB — OB RESULTS CONSOLE ANTIBODY SCREEN: Antibody Screen: NEGATIVE

## 2016-05-13 LAB — OB RESULTS CONSOLE RPR: RPR: NONREACTIVE

## 2016-05-13 LAB — OB RESULTS CONSOLE PLATELET COUNT: Platelets: 232 10*3/uL

## 2016-05-13 LAB — OB RESULTS CONSOLE VARICELLA ZOSTER ANTIBODY, IGG: Varicella: IMMUNE

## 2016-05-13 LAB — OB RESULTS CONSOLE HGB/HCT, BLOOD
HEMATOCRIT: 40 %
HEMOGLOBIN: 13.2 g/dL

## 2016-05-31 ENCOUNTER — Encounter (HOSPITAL_COMMUNITY): Payer: Self-pay

## 2016-05-31 ENCOUNTER — Inpatient Hospital Stay (HOSPITAL_COMMUNITY)
Admission: AD | Admit: 2016-05-31 | Discharge: 2016-05-31 | Disposition: A | Discharge status: Home / Self Care | Payer: Managed Care, Other (non HMO) | Source: Ambulatory Visit | Attending: Obstetrics & Gynecology | Admitting: Obstetrics & Gynecology

## 2016-05-31 ENCOUNTER — Inpatient Hospital Stay (HOSPITAL_COMMUNITY): Payer: Managed Care, Other (non HMO)

## 2016-05-31 DIAGNOSIS — Z9889 Other specified postprocedural states: Secondary | ICD-10-CM | POA: Diagnosis not present

## 2016-05-31 DIAGNOSIS — Z6791 Unspecified blood type, Rh negative: Secondary | ICD-10-CM | POA: Insufficient documentation

## 2016-05-31 DIAGNOSIS — Z3A08 8 weeks gestation of pregnancy: Secondary | ICD-10-CM | POA: Insufficient documentation

## 2016-05-31 DIAGNOSIS — O418X10 Other specified disorders of amniotic fluid and membranes, first trimester, not applicable or unspecified: Secondary | ICD-10-CM | POA: Diagnosis not present

## 2016-05-31 DIAGNOSIS — Z87891 Personal history of nicotine dependence: Secondary | ICD-10-CM | POA: Insufficient documentation

## 2016-05-31 DIAGNOSIS — Z888 Allergy status to other drugs, medicaments and biological substances status: Secondary | ICD-10-CM | POA: Diagnosis not present

## 2016-05-31 DIAGNOSIS — O468X1 Other antepartum hemorrhage, first trimester: Secondary | ICD-10-CM | POA: Diagnosis not present

## 2016-05-31 DIAGNOSIS — O209 Hemorrhage in early pregnancy, unspecified: Secondary | ICD-10-CM | POA: Diagnosis present

## 2016-05-31 DIAGNOSIS — O26891 Other specified pregnancy related conditions, first trimester: Secondary | ICD-10-CM

## 2016-05-31 LAB — URINALYSIS, ROUTINE W REFLEX MICROSCOPIC
Bacteria, UA: NONE SEEN
Bilirubin Urine: NEGATIVE
Glucose, UA: NEGATIVE mg/dL
Ketones, ur: 20 mg/dL — AB
Leukocytes, UA: NEGATIVE
Nitrite: NEGATIVE
Protein, ur: NEGATIVE mg/dL
Specific Gravity, Urine: 1.021 (ref 1.005–1.030)
pH: 6 (ref 5.0–8.0)

## 2016-05-31 LAB — CBC
HCT: 39.6 % (ref 36.0–46.0)
HEMOGLOBIN: 13.9 g/dL (ref 12.0–15.0)
MCH: 31.4 pg (ref 26.0–34.0)
MCHC: 35.1 g/dL (ref 30.0–36.0)
MCV: 89.6 fL (ref 78.0–100.0)
PLATELETS: 252 10*3/uL (ref 150–400)
RBC: 4.42 MIL/uL (ref 3.87–5.11)
RDW: 13 % (ref 11.5–15.5)
WBC: 14.5 10*3/uL — AB (ref 4.0–10.5)

## 2016-05-31 LAB — HCG, QUANTITATIVE, PREGNANCY: hCG, Beta Chain, Quant, S: 86266 m[IU]/mL — ABNORMAL HIGH (ref <5)

## 2016-05-31 LAB — POCT PREGNANCY, URINE: Preg Test, Ur: POSITIVE — AB

## 2016-05-31 LAB — ABO/RH: ABO/RH(D): O NEG

## 2016-05-31 MED ORDER — RHO D IMMUNE GLOBULIN 1500 UNIT/2ML IJ SOSY
300.0000 ug | PREFILLED_SYRINGE | Freq: Once | INTRAMUSCULAR | Status: AC
  Administered 2016-05-31: 300 ug via INTRAMUSCULAR
  Filled 2016-05-31: qty 2

## 2016-05-31 NOTE — Discharge Instructions (Signed)
Subchorionic Hematoma °A subchorionic hematoma is a gathering of blood between the outer wall of the placenta and the inner wall of the womb (uterus). The placenta is the organ that connects the fetus to the wall of the uterus. The placenta performs the feeding, breathing (oxygen to the fetus), and waste removal (excretory work) of the fetus.  °Subchorionic hematoma is the most common abnormality found on a result from ultrasonography done during the first trimester or early second trimester of pregnancy. If there has been little or no vaginal bleeding, early small hematomas usually shrink on their own and do not affect your baby or pregnancy. The blood is gradually absorbed over 1-2 weeks. When bleeding starts later in pregnancy or the hematoma is larger or occurs in an older pregnant woman, the outcome may not be as good. Larger hematomas may get bigger, which increases the chances for miscarriage. Subchorionic hematoma also increases the risk of premature detachment of the placenta from the uterus, preterm (premature) labor, and stillbirth. °HOME CARE INSTRUCTIONS °· Stay on bed rest if your health care provider recommends this. Although bed rest will not prevent more bleeding or prevent a miscarriage, your health care provider may recommend bed rest until you are advised otherwise. °· Avoid heavy lifting (more than 10 lb [4.5 kg]), exercise, sexual intercourse, or douching as directed by your health care provider. °· Keep track of the number of pads you use each day and how soaked (saturated) they are. Write down this information. °· Do not use tampons. °· Keep all follow-up appointments as directed by your health care provider. Your health care provider may ask you to have follow-up blood tests or ultrasound tests or both. °SEEK IMMEDIATE MEDICAL CARE IF: °· You have severe cramps in your stomach, back, abdomen, or pelvis. °· You have a fever. °· You pass large clots or tissue. Save any tissue for your health  care provider to look at. °· Your bleeding increases or you become lightheaded, feel weak, or have fainting episodes. °This information is not intended to replace advice given to you by your health care provider. Make sure you discuss any questions you have with your health care provider. °Document Released: 08/04/2006 Document Revised: 05/10/2014 Document Reviewed: 11/16/2012 °Elsevier Interactive Patient Education © 2017 [employer] ° °

## 2016-05-31 NOTE — MAU Note (Signed)
Pt presents to MAU with complaints of vaginal bleeding that started on Saturday. PT is receiving [facility] in [place] and had an Ultrasound on December the 11th with fetal heart tones  Reports dark brown bleeding at present. Lower abdominal cramping

## 2016-05-31 NOTE — MAU Provider Note (Signed)
History     CSN: [id number]  Arrival date and time: 05/31/16 1229   None     Chief Complaint  Patient presents with  . Vaginal Bleeding   HPI   [name] is  31 y.o. female [id number] @ [redacted]w[redacted]d here in MAU with concerns about miscarriage.  She started having vaginal bleeding that started on Saturday. On Sunday the bleeding turn to dark red and brown in color. Currently she is having dark red blood that is similar to a light menstrual cycle. She denies pain   OB History    Gravida Para Term Preterm AB Living   4 1 1   1 1    SAB TAB Ectopic Multiple Live Births   1              Past Medical History:  Diagnosis Date  . Gestational diabetes   . Ovarian cyst    2011    Past Surgical History:  Procedure Laterality Date  . cyst on ovary    . OVARIAN CYST REMOVAL  2011  . WISDOM TOOTH EXTRACTION      Family History  Problem Relation Age of Onset  . Hypertension Father   . Hyperlipidemia Father   . Heart disease Sister   . Heart disease Maternal Grandmother     Social History  Substance Use Topics  . Smoking status: Former Smoker    Packs/day: 15.00    Types: Cigarettes    Quit date: 07/23/2012  . Smokeless tobacco: Never Used  . Alcohol use No    Allergies:  Allergies  Allergen Reactions  . Sulfa Antibiotics Other (See Comments)    Red Blotches    Prescriptions Prior to Admission  Medication Sig Dispense Refill Last Dose  . Prenatal Vit-Fe Fumarate-FA (PRENATAL MULTIVITAMIN) TABS tablet Take 1 tablet by mouth at bedtime.   05/30/2016 at Unknown time   Results for orders placed or performed during the hospital encounter of 05/31/16 (from the past 48 hour(s))  Urinalysis, Routine w reflex microscopic     Status: Abnormal   Collection Time: 05/31/16  1:10 PM  Result Value Ref Range   Color, Urine YELLOW YELLOW   APPearance CLEAR CLEAR   Specific Gravity, Urine 1.021 1.005 - 1.030   pH 6.0 5.0 - 8.0   Glucose, UA NEGATIVE NEGATIVE mg/dL   Hgb urine  dipstick LARGE (A) NEGATIVE   Bilirubin Urine NEGATIVE NEGATIVE   Ketones, ur 20 (A) NEGATIVE mg/dL   Protein, ur NEGATIVE NEGATIVE mg/dL   Nitrite NEGATIVE NEGATIVE   Leukocytes, UA NEGATIVE NEGATIVE   RBC / HPF 0-5 0 - 5 RBC/hpf   WBC, UA 0-5 0 - 5 WBC/hpf   Bacteria, UA NONE SEEN NONE SEEN   Squamous Epithelial / LPF 0-5 (A) NONE SEEN   Mucous PRESENT   Pregnancy, urine POC     Status: Abnormal   Collection Time: 05/31/16  1:34 PM  Result Value Ref Range   Preg Test, Ur POSITIVE (A) NEGATIVE    Comment:        THE SENSITIVITY OF THIS METHODOLOGY IS >24 mIU/mL   hCG, quantitative, pregnancy     Status: Abnormal   Collection Time: 05/31/16  2:06 PM  Result Value Ref Range   hCG, Beta Chain, Quant, S 86,266 (H) <5 mIU/mL    Comment:          GEST. AGE      CONC.  (mIU/mL)   <=1 WEEK  5 - 50     2 WEEKS       50 - 500     3 WEEKS       100 - 10,000     4 WEEKS     1,000 - 30,000     5 WEEKS     3,500 - 115,000   6-8 WEEKS     12,000 - 270,000    12 WEEKS     15,000 - 220,000        FEMALE AND NON-PREGNANT FEMALE:     LESS THAN 5 mIU/mL   CBC     Status: Abnormal   Collection Time: 05/31/16  2:06 PM  Result Value Ref Range   WBC 14.5 (H) 4.0 - 10.5 K/uL   RBC 4.42 3.87 - 5.11 MIL/uL   Hemoglobin 13.9 12.0 - 15.0 g/dL   HCT 16.139.6 09.636.0 - 04.546.0 %   MCV 89.6 78.0 - 100.0 fL   MCH 31.4 26.0 - 34.0 pg   MCHC 35.1 30.0 - 36.0 g/dL   RDW 40.913.0 81.111.5 - 91.415.5 %   Platelets 252 150 - 400 K/uL  ABO/Rh     Status: None   Collection Time: 05/31/16  2:06 PM  Result Value Ref Range   ABO/RH(D) O NEG   Rh IG workup (includes ABO/Rh)     Status: None (Preliminary result)   Collection Time: 05/31/16  2:06 PM  Result Value Ref Range   Gestational Age(Wks) 9    ABO/RH(D) O NEG    Antibody Screen NEG    Unit Number [id number]    Blood Component Type RHIG    Unit division 00    Status of Unit ISSUED    Transfusion Status OK TO TRANSFUSE    [place] Ob Comp Less 14 Wks  Result  Date: 05/31/2016 CLINICAL DATA:  Vaginal bleeding in first trimester pregnancy, reportedly had normal IUP at physician's office ultrasound on 05/13/2016 per patient, has had pelvic pain and bleeding for 2 days, bright red blood and clots on Saturday, more darker blood today EXAM: OBSTETRIC <14 WK US AND TRANSVAGINAL OB US TECHNIQUE: Both transabdominal and transvaginal ultrasound examinations were performed for complete evaluation of the gestation as well as the maternal uterus, adnexal regions, and pelvic cul-de-sac. Transvaginal technique was performed to assess early pregnancy. COMPARISON:  None for this gestation FINDINGS: Intrauterine gestational sac: Present Yolk sac:  Present Embryo:  Present Cardiac Activity: Present Heart Rate: 186  bpm CRL:  24.9  mm   9 w   1 d                  US EDC: 01/02/2017 Subchorionic hemorrhage: Small subchorionic hemorrhage 12 x 13 x 7 mm. Maternal uterus/adnexae: LEFT ovary normal size and morphology, 2.5 x 3.1 x 2.3 cm. RIGHT ovary normal size and morphology, 2.8 x 3.9 x 2.7 cm. No free pelvic fluid or adnexal masses. IMPRESSION: Single live intrauterine gestation measured at 9 weeks 1 day EGA by crown-rump length. Small subchronic hemorrhage. Electronically Signed   By: Ulyses SouthwardMark  Boles M.D.   On: 05/31/2016 16:17   [place] Ob Transvaginal  Result Date: 05/31/2016 CLINICAL DATA:  Vaginal bleeding in first trimester pregnancy, reportedly had normal IUP at physician's office ultrasound on 05/13/2016 per patient, has had pelvic pain and bleeding for 2 days, bright red blood and clots on Saturday, more darker blood today EXAM: OBSTETRIC <14 WK US AND TRANSVAGINAL OB US TECHNIQUE: Both transabdominal and transvaginal ultrasound examinations  were performed for complete evaluation of the gestation as well as the maternal uterus, adnexal regions, and pelvic cul-de-sac. Transvaginal technique was performed to assess early pregnancy. COMPARISON:  None for this gestation FINDINGS:  Intrauterine gestational sac: Present Yolk sac:  Present Embryo:  Present Cardiac Activity: Present Heart Rate: 186  bpm CRL:  24.9  mm   9 w   1 d                  [place] EDC: 01/02/2017 Subchorionic hemorrhage: Small subchorionic hemorrhage 12 x 13 x 7 mm. Maternal uterus/adnexae: LEFT ovary normal size and morphology, 2.5 x 3.1 x 2.3 cm. RIGHT ovary normal size and morphology, 2.8 x 3.9 x 2.7 cm. No free pelvic fluid or adnexal masses. IMPRESSION: Single live intrauterine gestation measured at 9 weeks 1 day EGA by crown-rump length. Small subchronic hemorrhage. Electronically Signed   By: Ulyses Southward M.D.   On: 05/31/2016 16:17   Review of Systems  Constitutional: Negative for fever.  Genitourinary: Positive for vaginal bleeding.   Physical Exam   Blood pressure (!) 108/51, pulse 94, temperature 98.3 F (36.8 C), resp. rate 18, height 5\' 3"  (1.6 m), weight 212 lb (96.2 kg), last menstrual period 03/10/2016, unknown if currently breastfeeding.  Physical Exam  Constitutional: She is oriented to person, place, and time. She appears well-developed and well-nourished. No distress.  HENT:  Head: Normocephalic.  Eyes: Pupils are equal, round, and reactive to light.  Neck: Neck supple.  GI: Soft. She exhibits no distension. There is no tenderness. There is no rebound.  Genitourinary:  Genitourinary Comments: Bimanual exam: Cervix closed, posterior. Small amount of brown blood noted on exam glove.  Uterus non tender, enlarged  Chaperone present for exam.   Musculoskeletal: Normal range of motion.  Neurological: She is alert and oriented to person, place, and time.  Skin: Skin is warm. She is not diaphoretic.  Psychiatric: Her behavior is normal.    MAU Course  Procedures  None  MDM  [place] CBC Hcg  O negative blood type: Rhogam given IM  Assessment and Plan    SIUP @ [redacted]w[redacted]d  A:  1. Subchorionic hematoma in first trimester, single or unspecified fetus   2. Vaginal bleeding in  pregnancy, first trimester   3. Rh negative state in antepartum period, first trimester     P:  Discharge home in stable condition Follow up with OB Bleeding precautions Pelvic rest  Return to MAU if symptoms worsen    Duane Lope, NP 05/31/2016 8:56 PM

## 2016-06-01 LAB — RH IG WORKUP (INCLUDES ABO/RH)
ABO/RH(D): O NEG
Antibody Screen: NEGATIVE
Gestational Age(Wks): 9
Unit division: 0

## 2016-06-01 LAB — HIV ANTIBODY (ROUTINE TESTING W REFLEX): HIV Screen 4th Generation wRfx: NONREACTIVE

## 2016-07-11 ENCOUNTER — Inpatient Hospital Stay (HOSPITAL_COMMUNITY): Payer: 59

## 2016-07-11 ENCOUNTER — Encounter (HOSPITAL_COMMUNITY): Payer: Self-pay

## 2016-07-11 ENCOUNTER — Inpatient Hospital Stay (HOSPITAL_COMMUNITY)
Admission: AD | Admit: 2016-07-11 | Discharge: 2016-07-11 | Disposition: A | Discharge status: Home / Self Care | Payer: 59 | Source: Ambulatory Visit | Attending: Obstetrics & Gynecology | Admitting: Obstetrics & Gynecology

## 2016-07-11 DIAGNOSIS — Z8249 Family history of ischemic heart disease and other diseases of the circulatory system: Secondary | ICD-10-CM | POA: Insufficient documentation

## 2016-07-11 DIAGNOSIS — O2342 Unspecified infection of urinary tract in pregnancy, second trimester: Secondary | ICD-10-CM | POA: Diagnosis not present

## 2016-07-11 DIAGNOSIS — R110 Nausea: Secondary | ICD-10-CM | POA: Insufficient documentation

## 2016-07-11 DIAGNOSIS — R509 Fever, unspecified: Secondary | ICD-10-CM | POA: Insufficient documentation

## 2016-07-11 DIAGNOSIS — O26892 Other specified pregnancy related conditions, second trimester: Secondary | ICD-10-CM | POA: Diagnosis present

## 2016-07-11 DIAGNOSIS — Z87891 Personal history of nicotine dependence: Secondary | ICD-10-CM | POA: Insufficient documentation

## 2016-07-11 DIAGNOSIS — O3482 Maternal care for other abnormalities of pelvic organs, second trimester: Secondary | ICD-10-CM | POA: Diagnosis present

## 2016-07-11 DIAGNOSIS — R3911 Hesitancy of micturition: Secondary | ICD-10-CM | POA: Diagnosis not present

## 2016-07-11 DIAGNOSIS — O9989 Other specified diseases and conditions complicating pregnancy, childbirth and the puerperium: Secondary | ICD-10-CM

## 2016-07-11 DIAGNOSIS — Z3A15 15 weeks gestation of pregnancy: Secondary | ICD-10-CM | POA: Diagnosis present

## 2016-07-11 DIAGNOSIS — M549 Dorsalgia, unspecified: Secondary | ICD-10-CM | POA: Insufficient documentation

## 2016-07-11 DIAGNOSIS — Z79899 Other long term (current) drug therapy: Secondary | ICD-10-CM | POA: Diagnosis not present

## 2016-07-11 DIAGNOSIS — Z888 Allergy status to other drugs, medicaments and biological substances status: Secondary | ICD-10-CM | POA: Diagnosis not present

## 2016-07-11 DIAGNOSIS — O99891 Other specified diseases and conditions complicating pregnancy: Secondary | ICD-10-CM

## 2016-07-11 DIAGNOSIS — R109 Unspecified abdominal pain: Secondary | ICD-10-CM

## 2016-07-11 LAB — CBC
HCT: 38.3 % (ref 36.0–46.0)
HEMOGLOBIN: 13.3 g/dL (ref 12.0–15.0)
MCH: 32 pg (ref 26.0–34.0)
MCHC: 34.7 g/dL (ref 30.0–36.0)
MCV: 92.1 fL (ref 78.0–100.0)
PLATELETS: 227 10*3/uL (ref 150–400)
RBC: 4.16 MIL/uL (ref 3.87–5.11)
RDW: 13.1 % (ref 11.5–15.5)
WBC: 10.5 10*3/uL (ref 4.0–10.5)

## 2016-07-11 LAB — URINALYSIS, ROUTINE W REFLEX MICROSCOPIC
BILIRUBIN URINE: NEGATIVE
GLUCOSE, UA: NEGATIVE mg/dL
Hgb urine dipstick: NEGATIVE
Ketones, ur: NEGATIVE mg/dL
Leukocytes, UA: NEGATIVE
NITRITE: NEGATIVE
PH: 5 (ref 5.0–8.0)
Protein, ur: NEGATIVE mg/dL
SPECIFIC GRAVITY, URINE: 1.027 (ref 1.005–1.030)

## 2016-07-11 MED ORDER — CEPHALEXIN 500 MG PO CAPS: 500.0000 mg | ORAL_CAPSULE | Freq: Four times a day (QID) | ORAL | 0 refills | Status: AC

## 2016-07-11 MED ORDER — CYCLOBENZAPRINE HCL 10 MG PO TABS: 10.0000 mg | ORAL_TABLET | Freq: Three times a day (TID) | ORAL | 0 refills | Status: DC | PRN

## 2016-07-11 NOTE — MAU Provider Note (Signed)
History     CSN: [id number]  Arrival date and time: 07/11/16 1111   First Provider Initiated Contact with Patient 07/11/16 1143      Chief Complaint  Patient presents with  . Back Pain  . Fever   [id number] @15  weeks here with back pain and LAP that started 3 days ago. She reports HA and chills onset the same day. Back pain is intermittent, bilateral and lower. Rates pain 2/10. She used Tylenol last night and had some relief. Reports temp 101.3 last night. No sick contacts. Denies dysuria but reports difficulty starting stream and small amounts of output. Denies hematuria, urgency, or frequency. Eating and drinking well. Reports daily nausea since early pregnancy, no worsening, no vomiting. [facility]. No records on file.    OB History    Gravida Para Term Preterm AB Living   3 1 1   1 1    SAB TAB Ectopic Multiple Live Births   1              Past Medical History:  Diagnosis Date  . Gestational diabetes   . Ovarian cyst    2011    Past Surgical History:  Procedure Laterality Date  . cyst on ovary    . OVARIAN CYST REMOVAL  2011  . WISDOM TOOTH EXTRACTION      Family History  Problem Relation Age of Onset  . Hypertension Father   . Hyperlipidemia Father   . Heart disease Sister   . Heart disease Maternal Grandmother     Social History  Substance Use Topics  . Smoking status: Former Smoker    Packs/day: 15.00    Types: Cigarettes    Quit date: 07/23/2012  . Smokeless tobacco: Never Used  . Alcohol use No    Allergies:  Allergies  Allergen Reactions  . Sulfa Antibiotics Other (See Comments)    Red Blotches    Prescriptions Prior to Admission  Medication Sig Dispense Refill Last Dose  . Prenatal Vit-Fe Fumarate-FA (PRENATAL MULTIVITAMIN) TABS tablet Take 1 tablet by mouth at bedtime.   05/30/2016 at Unknown time    Review of Systems  Constitutional: Negative for chills and fever.  HENT: Negative for congestion and sore throat.    Respiratory: Negative for shortness of breath.   Gastrointestinal: Positive for abdominal pain and nausea. Negative for diarrhea and vomiting.  Genitourinary: Positive for difficulty urinating. Negative for frequency, hematuria, urgency, vaginal bleeding and vaginal discharge.  Musculoskeletal: Positive for back pain (lower).   Physical Exam   Blood pressure 123/69, pulse 102, temperature 98.3 F (36.8 C), temperature source Oral, resp. rate 18, height 5\' 3"  (1.6 m), weight 99.9 kg (220 lb 4 oz), last menstrual period 03/10/2016, SpO2 98 %, unknown if currently breastfeeding.  Physical Exam  Nursing note and vitals reviewed. Constitutional: She is oriented to person, place, and time. She appears well-developed and well-nourished. No distress (appears comfortable).  HENT:  Head: Normocephalic and atraumatic.  Neck: Normal range of motion.  Respiratory: Effort normal.  GI: Soft. She exhibits no distension. There is tenderness (bilat lower quadrants). There is CVA tenderness (bilateral).  Musculoskeletal: Normal range of motion.  Neurological: She is alert and oriented to person, place, and time.  Skin: Skin is warm and dry.  Psychiatric: She has a normal mood and affect.  FHT: 152 bpm  Results for orders placed or performed during the hospital encounter of 07/11/16 (from the past 24 hour(s))  Urinalysis, Routine w reflex  microscopic     Status: Abnormal   Collection Time: 07/11/16 11:20 AM  Result Value Ref Range   Color, Urine YELLOW YELLOW   APPearance HAZY (A) CLEAR   Specific Gravity, Urine 1.027 1.005 - 1.030   pH 5.0 5.0 - 8.0   Glucose, UA NEGATIVE NEGATIVE mg/dL   Hgb urine dipstick NEGATIVE NEGATIVE   Bilirubin Urine NEGATIVE NEGATIVE   Ketones, ur NEGATIVE NEGATIVE mg/dL   Protein, ur NEGATIVE NEGATIVE mg/dL   Nitrite NEGATIVE NEGATIVE   Leukocytes, UA NEGATIVE NEGATIVE  CBC     Status: None   Collection Time: 07/11/16 12:00 PM  Result Value Ref Range   WBC 10.5  4.0 - 10.5 K/uL   RBC 4.16 3.87 - 5.11 MIL/uL   Hemoglobin 13.3 12.0 - 15.0 g/dL   HCT 13.0 86.5 - 78.4 %   MCV 92.1 78.0 - 100.0 fL   MCH 32.0 26.0 - 34.0 pg   MCHC 34.7 30.0 - 36.0 g/dL   RDW 69.6 29.5 - 28.4 %   Platelets 227 150 - 400 K/uL   US Renal  Result Date: 07/11/2016 CLINICAL DATA:  Several days of bilateral back/ flank pain with fever and chills. Patient is in the second trimester of pregnancy. EXAM: RENAL / URINARY TRACT ULTRASOUND COMPLETE COMPARISON:  10/31/2013 abdominal sonogram. FINDINGS: Right Kidney: Length: 11.2 cm. Echogenicity within normal limits. No mass or hydronephrosis visualized. Left Kidney: Length: 11.9 cm. Echogenicity within normal limits. No mass or hydronephrosis visualized. Bladder: Appears normal for degree of bladder distention. IMPRESSION: Normal ultrasound of the kidneys and bladder. Electronically Signed   By: Delbert Phenix M.D.   On: 07/11/2016 12:57   MAU Course  Procedures  MDM Labs and [place] ordered and reviewed. No evidence of nephrolithiasis on [place]. Pain could be MSK but pt adamant against this cause. No evidence of UTI on UA, UC sent, and will treat presumptively if pain doesn't respond to Flexeril, Tylenol, and heat. No other sources of fever identified today. Stable for discharge home.   Assessment and Plan   1. [redacted] weeks gestation of pregnancy   2. Flank pain   3. Back pain affecting pregnancy   4. Urinary tract infection in mother during second trimester of pregnancy   5. Urinary hesitancy   6. Musculoskeletal back pain    Discharge home Follow up with primary OB this week Tylenol prn Heat prn Rx flexeril Rx Keflex  Allergies as of 07/11/2016      Reactions   Sulfa Antibiotics Hives      Medication List    TAKE these medications   cephALEXin 500 MG capsule Commonly known as:  KEFLEX Take 1 capsule (500 mg total) by mouth 4 (four) times daily.   cyclobenzaprine 10 MG tablet Commonly known as:  FLEXERIL Take 1 tablet (10 mg  total) by mouth every 8 (eight) hours as needed for muscle spasms.   prenatal multivitamin Tabs tablet Take 1 tablet by mouth at bedtime.      Donette Larry, CNM 07/11/2016, 11:46 AM

## 2016-07-11 NOTE — MAU Note (Signed)
Back Pain started on Thurs.  Aching.  Temp of 101 last night.  Has been having chills. Some burning when she urinates, only going small amts

## 2016-07-11 NOTE — Discharge Instructions (Signed)
Back Pain, Adult Back pain is very common. The pain often gets better over time. The cause of back pain is usually not dangerous. Most people can learn to manage their back pain on their own. Follow these instructions at home: Watch your back pain for any changes. The following actions may help to lessen any pain you are feeling:  Stay active. Start with short walks on flat ground if you can. Try to walk farther each day.  Exercise regularly as told by your doctor. Exercise helps your back heal faster. It also helps avoid future injury by keeping your muscles strong and flexible.  Do not sit, drive, or stand in one place for more than 30 minutes.  Do not stay in bed. Resting more than 1-2 days can slow down your recovery.  Be careful when you bend or lift an object. Use good form when lifting:  Bend at your knees.  Keep the object close to your body.  Do not twist.  Sleep on a firm mattress. Lie on your side, and bend your knees. If you lie on your back, put a pillow under your knees.  Take medicines only as told by your doctor.  Put ice on the injured area.  Put ice in a plastic bag.  Place a towel between your skin and the bag.  Leave the ice on for 20 minutes, 2-3 times a day for the first 2-3 days. After that, you can switch between ice and heat packs.  Avoid feeling anxious or stressed. Find good ways to deal with stress, such as exercise.  Maintain a healthy weight. Extra weight puts stress on your back. Contact a doctor if:  You have pain that does not go away with rest or medicine.  You have worsening pain that goes down into your legs or buttocks.  You have pain that does not get better in one week.  You have pain at night.  You lose weight.  You have a fever or chills. Get help right away if:  You cannot control when you poop (bowel movement) or pee (urinate).  Your arms or legs feel weak.  Your arms or legs lose feeling (numbness).  You feel sick to  your stomach (nauseous) or throw up (vomit).  You have belly (abdominal) pain.  You feel like you may pass out (faint). This information is not intended to replace advice given to you by your health care provider. Make sure you discuss any questions you have with your health care provider. Document Released: 10/06/2007 Document Revised: 09/25/2015 Document Reviewed: 08/21/2013 Elsevier Interactive Patient Education  2017 [employer] Pregnancy and Urinary Tract Infection What is a urinary tract infection? A urinary tract infection (UTI) is an infection of any part of the urinary tract. This includes the kidneys, the tubes that connect your kidneys to your bladder (ureters), the bladder, and the tube that carries urine out of your body (urethra). These organs make, store, and get rid of urine in the body. A UTI can be a bladder infection (cystitis) or a kidney infection (pyelonephritis). This infection may be caused by fungi, viruses, and bacteria. Bacteria are the most common cause of UTIs. You are more likely to develop a UTI during pregnancy because:  The physical and hormonal changes your body goes through can make it easier for bacteria to get into your urinary tract.  Your growing baby puts pressure on your uterus and can affect urine flow. Does a UTI place my baby at risk? An  untreated UTI during pregnancy could lead to a kidney infection, which can cause health problems that could affect your baby. Possible complications of an untreated UTI include:  Having your baby before 37 weeks of pregnancy (premature).  Having a baby with a low birth weight.  Developing high blood pressure during pregnancy (preeclampsia). What are the symptoms of a UTI? Symptoms of a UTI include:  Fever.  Frequent urination or passing small amounts of urine frequently.  Needing to urinate urgently.  Pain or a burning sensation with urination.  Urine that smells bad or unusual.  Cloudy  urine.  Pain in the lower abdomen or back.  Trouble urinating.  Blood in the urine.  Vomiting or being less hungry than normal.  Diarrhea or abdominal pain.  Vaginal discharge. What are the treatment options for a UTI during pregnancy? Treatment for this condition may include:  Antibiotic medicines that are safe to take during pregnancy.  Other medicines to treat less common causes of UTI. How can I prevent a UTI?   To prevent a UTI:  Go to the bathroom as soon as you feel the need.  Always wipe from front to back.  Wash your genital area with soap and warm water daily.  Empty your bladder before and after sex.  Wear cotton underwear.  Limit your intake of high sugar foods or drinks, such as regular soda, juice, and sweets.  Drink 6-8 glasses of water daily.  Do not wear tight-fitting pants.  Do not douche or use deodorant sprays.  Do not drink alcohol, caffeine, or carbonated drinks. These can irritate the bladder. Contact a health care provider if:  Your symptoms do not improve or get worse.  You have a fever after two days of treatment.  You have a rash.  You have abnormal vaginal discharge.  You have back or side pain.  You have chills.  You have nausea and vomiting. Get help right away if: Seek immediate medical care if you are pregnant and:  You feel contractions in your uterus.  You have lower belly pain.  You have a gush of fluid from your vagina.  You have blood in your urine.  You are vomiting and cannot keep down any medicines or water. This information is not intended to replace advice given to you by your health care provider. Make sure you discuss any questions you have with your health care provider. Document Released: 08/14/2010 Document Revised: 04/02/2016 Document Reviewed: 03/10/2015 Elsevier Interactive Patient Education  2017 [employer].

## 2016-07-12 LAB — CULTURE, OB URINE

## 2016-07-23 ENCOUNTER — Ambulatory Visit (INDEPENDENT_AMBULATORY_CARE_PROVIDER_SITE_OTHER): Payer: 59 | Admitting: Obstetrics and Gynecology

## 2016-07-23 ENCOUNTER — Encounter: Payer: Self-pay | Admitting: Obstetrics and Gynecology

## 2016-07-23 VITALS — BP 122/74 | Wt 222.0 lb

## 2016-07-23 DIAGNOSIS — Z3A16 16 weeks gestation of pregnancy: Secondary | ICD-10-CM

## 2016-07-23 DIAGNOSIS — O26892 Other specified pregnancy related conditions, second trimester: Secondary | ICD-10-CM

## 2016-07-23 DIAGNOSIS — Z8632 Personal history of gestational diabetes: Secondary | ICD-10-CM

## 2016-07-23 DIAGNOSIS — Z6836 Body mass index (BMI) 36.0-36.9, adult: Secondary | ICD-10-CM | POA: Insufficient documentation

## 2016-07-23 DIAGNOSIS — O26893 Other specified pregnancy related conditions, third trimester: Secondary | ICD-10-CM

## 2016-07-23 DIAGNOSIS — O99212 Obesity complicating pregnancy, second trimester: Secondary | ICD-10-CM

## 2016-07-23 DIAGNOSIS — O0992 Supervision of high risk pregnancy, unspecified, second trimester: Secondary | ICD-10-CM

## 2016-07-23 DIAGNOSIS — O99213 Obesity complicating pregnancy, third trimester: Secondary | ICD-10-CM | POA: Insufficient documentation

## 2016-07-23 DIAGNOSIS — O09892 Supervision of other high risk pregnancies, second trimester: Secondary | ICD-10-CM

## 2016-07-23 DIAGNOSIS — O0993 Supervision of high risk pregnancy, unspecified, third trimester: Secondary | ICD-10-CM | POA: Insufficient documentation

## 2016-07-23 DIAGNOSIS — Z6791 Unspecified blood type, Rh negative: Secondary | ICD-10-CM

## 2016-07-23 NOTE — Progress Notes (Signed)
[facility] for what she thought was a kidney infection.  She now believes it was back muscle spasms. She is doing pregnancy yoga and her issue has resolved. No vb.  Desires msAFP

## 2016-07-25 LAB — AFP, SERUM, OPEN SPINA BIFIDA
AFP MOM: 0.91
AFP VALUE AFPOSL: 23.5 ng/mL
Gest. Age on Collection Date: 16 weeks
MATERNAL AGE AT EDD: 33.3 a
OSBR Risk 1 IN: 10000
Test Results:: NEGATIVE
Weight: 222 [lb_av]

## 2016-07-27 LAB — HPV APTIMA: HPV APTIMA: NEGATIVE

## 2016-08-20 ENCOUNTER — Ambulatory Visit (INDEPENDENT_AMBULATORY_CARE_PROVIDER_SITE_OTHER): Payer: 59 | Admitting: Advanced Practice Midwife

## 2016-08-20 ENCOUNTER — Ambulatory Visit (INDEPENDENT_AMBULATORY_CARE_PROVIDER_SITE_OTHER): Payer: 59

## 2016-08-20 VITALS — BP 112/66 | Wt 225.0 lb

## 2016-08-20 DIAGNOSIS — O0992 Supervision of high risk pregnancy, unspecified, second trimester: Secondary | ICD-10-CM | POA: Diagnosis not present

## 2016-08-20 DIAGNOSIS — Z3A20 20 weeks gestation of pregnancy: Secondary | ICD-10-CM

## 2016-08-20 DIAGNOSIS — Z8632 Personal history of gestational diabetes: Secondary | ICD-10-CM | POA: Diagnosis not present

## 2016-08-20 DIAGNOSIS — O99212 Obesity complicating pregnancy, second trimester: Secondary | ICD-10-CM

## 2016-08-20 DIAGNOSIS — IMO0002 Reserved for concepts with insufficient information to code with codable children: Secondary | ICD-10-CM

## 2016-08-20 DIAGNOSIS — Z0489 Encounter for examination and observation for other specified reasons: Secondary | ICD-10-CM

## 2016-08-20 DIAGNOSIS — Z048 Encounter for examination and observation for other specified reasons: Secondary | ICD-10-CM

## 2016-08-20 NOTE — Progress Notes (Signed)
Doing well. Anatomy scan incomplete today for heart, 3VC and 2UA. Follow up scan next visit. Work note given today.

## 2016-08-20 NOTE — Progress Notes (Signed)
Anatomy scan today/Its a BOY

## 2016-09-17 ENCOUNTER — Ambulatory Visit (INDEPENDENT_AMBULATORY_CARE_PROVIDER_SITE_OTHER): Payer: 59 | Admitting: Certified Nurse Midwife

## 2016-09-17 ENCOUNTER — Ambulatory Visit (INDEPENDENT_AMBULATORY_CARE_PROVIDER_SITE_OTHER): Payer: 59

## 2016-09-17 VITALS — BP 102/62 | Wt 224.0 lb

## 2016-09-17 DIAGNOSIS — Z048 Encounter for examination and observation for other specified reasons: Secondary | ICD-10-CM

## 2016-09-17 DIAGNOSIS — O0992 Supervision of high risk pregnancy, unspecified, second trimester: Secondary | ICD-10-CM

## 2016-09-17 DIAGNOSIS — O09899 Supervision of other high risk pregnancies, unspecified trimester: Secondary | ICD-10-CM

## 2016-09-17 DIAGNOSIS — Z6791 Unspecified blood type, Rh negative: Secondary | ICD-10-CM

## 2016-09-17 DIAGNOSIS — O26899 Other specified pregnancy related conditions, unspecified trimester: Secondary | ICD-10-CM

## 2016-09-17 DIAGNOSIS — Z3A24 24 weeks gestation of pregnancy: Secondary | ICD-10-CM

## 2016-09-17 DIAGNOSIS — IMO0002 Reserved for concepts with insufficient information to code with codable children: Secondary | ICD-10-CM

## 2016-09-17 DIAGNOSIS — Z0489 Encounter for examination and observation for other specified reasons: Secondary | ICD-10-CM

## 2016-09-17 NOTE — Progress Notes (Signed)
F/u anatomy scan today. Pt reports no problems. Back pain improving with prenatal yoga and pregnancy support pillow.

## 2016-09-21 NOTE — Progress Notes (Signed)
Anatomy scan now complete for heart and 3 vessel cord. +FM ROB/ 28 week labs and Rhogam in 4 weeks

## 2016-10-15 ENCOUNTER — Other Ambulatory Visit: Payer: 59

## 2016-10-15 ENCOUNTER — Ambulatory Visit (INDEPENDENT_AMBULATORY_CARE_PROVIDER_SITE_OTHER): Payer: 59 | Admitting: Obstetrics & Gynecology

## 2016-10-15 VITALS — BP 120/80 | Wt 227.0 lb

## 2016-10-15 DIAGNOSIS — O99212 Obesity complicating pregnancy, second trimester: Secondary | ICD-10-CM

## 2016-10-15 DIAGNOSIS — Z6791 Unspecified blood type, Rh negative: Secondary | ICD-10-CM

## 2016-10-15 DIAGNOSIS — O26899 Other specified pregnancy related conditions, unspecified trimester: Principal | ICD-10-CM

## 2016-10-15 DIAGNOSIS — O09899 Supervision of other high risk pregnancies, unspecified trimester: Secondary | ICD-10-CM

## 2016-10-15 DIAGNOSIS — O0992 Supervision of high risk pregnancy, unspecified, second trimester: Secondary | ICD-10-CM

## 2016-10-15 DIAGNOSIS — Z8632 Personal history of gestational diabetes: Secondary | ICD-10-CM

## 2016-10-15 DIAGNOSIS — Z3A28 28 weeks gestation of pregnancy: Secondary | ICD-10-CM

## 2016-10-15 MED ORDER — RHO D IMMUNE GLOBULIN 1500 UNIT/2ML IJ SOSY
300.0000 ug | PREFILLED_SYRINGE | Freq: Once | INTRAMUSCULAR | Status: AC
  Administered 2016-10-15: 300 ug via INTRAMUSCULAR

## 2016-10-15 NOTE — Addendum Note (Signed)
Addended by: Cornelius MorasPATTERSON, Delanie Tirrell D on: 10/15/2016 09:53 AM   Modules accepted: Orders

## 2016-10-15 NOTE — Patient Instructions (Signed)
Third Trimester of Pregnancy The third trimester is from week 28 through week 40 (months 7 through 9). The third trimester is a time when the unborn baby (fetus) is growing rapidly. At the end of the ninth month, the fetus is about 20 inches in length and weighs 6-10 pounds. Body changes during your third trimester Your body will continue to go through many changes during pregnancy. The changes vary from woman to woman. During the third trimester:  Your weight will continue to increase. You can expect to gain 25-35 pounds (11-16 kg) by the end of the pregnancy.  You may begin to get stretch marks on your hips, abdomen, and breasts.  You may urinate more often because the fetus is moving lower into your pelvis and pressing on your bladder.  You may develop or continue to have heartburn. This is caused by increased hormones that slow down muscles in the digestive tract.  You may develop or continue to have constipation because increased hormones slow digestion and cause the muscles that push waste through your intestines to relax.  You may develop hemorrhoids. These are swollen veins (varicose veins) in the rectum that can itch or be painful.  You may develop swollen, bulging veins (varicose veins) in your legs.  You may have increased body aches in the pelvis, back, or thighs. This is due to weight gain and increased hormones that are relaxing your joints.  You may have changes in your hair. These can include thickening of your hair, rapid growth, and changes in texture. Some women also have hair loss during or after pregnancy, or hair that feels dry or thin. Your hair will most likely return to normal after your baby is born.  Your breasts will continue to grow and they will continue to become tender. A yellow fluid (colostrum) may leak from your breasts. This is the first milk you are producing for your baby.  Your belly button may stick out.  You may notice more swelling in your hands,  face, or ankles.  You may have increased tingling or numbness in your hands, arms, and legs. The skin on your belly may also feel numb.  You may feel short of breath because of your expanding uterus.  You may have more problems sleeping. This can be caused by the size of your belly, increased need to urinate, and an increase in your body's metabolism.  You may notice the fetus "dropping," or moving lower in your abdomen (lightening).  You may have increased vaginal discharge.  You may notice your joints feel loose and you may have pain around your pelvic bone.  What to expect at prenatal visits You will have prenatal exams every 2 weeks until week 36. Then you will have weekly prenatal exams. During a routine prenatal visit:  You will be weighed to make sure you and the baby are growing normally.  Your blood pressure will be taken.  Your abdomen will be measured to track your baby's growth.  The fetal heartbeat will be listened to.  Any test results from the previous visit will be discussed.  You may have a cervical check near your due date to see if your cervix has softened or thinned (effaced).  You will be tested for Group B streptococcus. This happens between 35 and 37 weeks.  Your health care provider may ask you:  What your birth plan is.  How you are feeling.  If you are feeling the baby move.  If you have had   any abnormal symptoms, such as leaking fluid, bleeding, severe headaches, or abdominal cramping.  If you are using any tobacco products, including cigarettes, chewing tobacco, and electronic cigarettes.  If you have any questions.  Other tests or screenings that may be performed during your third trimester include:  Blood tests that check for low iron levels (anemia).  Fetal testing to check the health, activity level, and growth of the fetus. Testing is done if you have certain medical conditions or if there are problems during the  pregnancy.  Nonstress test (NST). This test checks the health of your baby to make sure there are no signs of problems, such as the baby not getting enough oxygen. During this test, a belt is placed around your belly. The baby is made to move, and its heart rate is monitored during movement.  What is false labor? False labor is a condition in which you feel small, irregular tightenings of the muscles in the womb (contractions) that usually go away with rest, changing position, or drinking water. These are called Braxton Hicks contractions. Contractions may last for hours, days, or even weeks before true labor sets in. If contractions come at regular intervals, become more frequent, increase in intensity, or become painful, you should see your health care provider. What are the signs of labor?  Abdominal cramps.  Regular contractions that start at 10 minutes apart and become stronger and more frequent with time.  Contractions that start on the top of the uterus and spread down to the lower abdomen and back.  Increased pelvic pressure and dull back pain.  A watery or bloody mucus discharge that comes from the vagina.  Leaking of amniotic fluid. This is also known as your "water breaking." It could be a slow trickle or a gush. Let your health care provider know if it has a color or strange odor. If you have any of these signs, call your health care provider right away, even if it is before your due date. Follow these instructions at home: Medicines  Follow your health care provider's instructions regarding medicine use. Specific medicines may be either safe or unsafe to take during pregnancy.  Take a prenatal vitamin that contains at least 600 micrograms (mcg) of folic acid.  If you develop constipation, try taking a stool softener if your health care provider approves. Eating and drinking  Eat a balanced diet that includes fresh fruits and vegetables, whole grains, good sources of protein  such as meat, eggs, or tofu, and low-fat dairy. Your health care provider will help you determine the amount of weight gain that is right for you.  Avoid raw meat and uncooked cheese. These carry germs that can cause birth defects in the baby.  If you have low calcium intake from food, talk to your health care provider about whether you should take a daily calcium supplement.  Eat four or five small meals rather than three large meals a day.  Limit foods that are high in fat and processed sugars, such as fried and sweet foods.  To prevent constipation: ? Drink enough fluid to keep your urine clear or pale yellow. ? Eat foods that are high in fiber, such as fresh fruits and vegetables, whole grains, and beans. Activity  Exercise only as directed by your health care provider. Most women can continue their usual exercise routine during pregnancy. Try to exercise for 30 minutes at least 5 days a week. Stop exercising if you experience uterine contractions.  Avoid heavy   lifting.  Do not exercise in extreme heat or humidity, or at high altitudes.  Wear low-heel, comfortable shoes.  Practice good posture.  You may continue to have sex unless your health care provider tells you otherwise. Relieving pain and discomfort  Take frequent breaks and rest with your legs elevated if you have leg cramps or low back pain.  Take warm sitz baths to soothe any pain or discomfort caused by hemorrhoids. Use hemorrhoid cream if your health care provider approves.  Wear a good support bra to prevent discomfort from breast tenderness.  If you develop varicose veins: ? Wear support pantyhose or compression stockings as told by your healthcare provider. ? Elevate your feet for 15 minutes, 3-4 times a day. Prenatal care  Write down your questions. Take them to your prenatal visits.  Keep all your prenatal visits as told by your health care provider. This is important. Safety  Wear your seat belt at  all times when driving.  Make a list of emergency phone numbers, including numbers for family, friends, the hospital, and police and fire departments. General instructions  Avoid cat litter boxes and soil used by cats. These carry germs that can cause birth defects in the baby. If you have a cat, ask someone to clean the litter box for you.  Do not travel far distances unless it is absolutely necessary and only with the approval of your health care provider.  Do not use hot tubs, steam rooms, or saunas.  Do not drink alcohol.  Do not use any products that contain nicotine or tobacco, such as cigarettes and e-cigarettes. If you need help quitting, ask your health care provider.  Do not use any medicinal herbs or unprescribed drugs. These chemicals affect the formation and growth of the baby.  Do not douche or use tampons or scented sanitary pads.  Do not cross your legs for long periods of time.  To prepare for the arrival of your baby: ? Take prenatal classes to understand, practice, and ask questions about labor and delivery. ? Make a trial run to the hospital. ? Visit the hospital and tour the maternity area. ? Arrange for maternity or paternity leave through employers. ? Arrange for family and friends to take care of pets while you are in the hospital. ? Purchase a rear-facing car seat and make sure you know how to install it in your car. ? Pack your hospital bag. ? Prepare the baby's nursery. Make sure to remove all pillows and stuffed animals from the baby's crib to prevent suffocation.  Visit your dentist if you have not gone during your pregnancy. Use a soft toothbrush to brush your teeth and be gentle when you floss. Contact a health care provider if:  You are unsure if you are in labor or if your water has broken.  You become dizzy.  You have mild pelvic cramps, pelvic pressure, or nagging pain in your abdominal area.  You have lower back pain.  You have persistent  nausea, vomiting, or diarrhea.  You have an unusual or bad smelling vaginal discharge.  You have pain when you urinate. Get help right away if:  Your water breaks before 37 weeks.  You have regular contractions less than 5 minutes apart before 37 weeks.  You have a fever.  You are leaking fluid from your vagina.  You have spotting or bleeding from your vagina.  You have severe abdominal pain or cramping.  You have rapid weight loss or weight gain.    You have shortness of breath with chest pain.  You notice sudden or extreme swelling of your face, hands, ankles, feet, or legs.  Your baby makes fewer than 10 movements in 2 hours.  You have severe headaches that do not go away when you take medicine.  You have vision changes. Summary  The third trimester is from week 28 through week 40, months 7 through 9. The third trimester is a time when the unborn baby (fetus) is growing rapidly.  During the third trimester, your discomfort may increase as you and your baby continue to gain weight. You may have abdominal, leg, and back pain, sleeping problems, and an increased need to urinate.  During the third trimester your breasts will keep growing and they will continue to become tender. A yellow fluid (colostrum) may leak from your breasts. This is the first milk you are producing for your baby.  False labor is a condition in which you feel small, irregular tightenings of the muscles in the womb (contractions) that eventually go away. These are called Braxton Hicks contractions. Contractions may last for hours, days, or even weeks before true labor sets in.  Signs of labor can include: abdominal cramps; regular contractions that start at 10 minutes apart and become stronger and more frequent with time; watery or bloody mucus discharge that comes from the vagina; increased pelvic pressure and dull back pain; and leaking of amniotic fluid. This information is not intended to replace advice  given to you by your health care provider. Make sure you discuss any questions you have with your health care provider. Document Released: 04/13/2001 Document Revised: 09/25/2015 Document Reviewed: 06/20/2012 Elsevier Interactive Patient Education  2017 [employer]  

## 2016-10-15 NOTE — Progress Notes (Signed)
Glucola today, [facility]am[facility]

## 2016-10-16 LAB — 28 WEEKS RH-PANEL
ANTIBODY SCREEN: NEGATIVE
Gestational Diabetes Screen: 152 mg/dL — ABNORMAL HIGH (ref 65–139)
HIV Screen 4th Generation wRfx: NONREACTIVE
RPR Ser Ql: NONREACTIVE

## 2016-10-20 ENCOUNTER — Telehealth: Payer: Self-pay | Admitting: Certified Nurse Midwife

## 2016-10-20 ENCOUNTER — Encounter: Payer: Self-pay | Admitting: Certified Nurse Midwife

## 2016-10-20 DIAGNOSIS — O24419 Gestational diabetes mellitus in pregnancy, unspecified control: Secondary | ICD-10-CM | POA: Insufficient documentation

## 2016-10-20 DIAGNOSIS — Z130 Encounter for screening for diseases of the blood and blood-forming organs and certain disorders involving the immune mechanism: Secondary | ICD-10-CM

## 2016-10-20 DIAGNOSIS — R7309 Other abnormal glucose: Secondary | ICD-10-CM

## 2016-10-20 NOTE — Telephone Encounter (Signed)
Elevated 1 hour GTT. Will schedule for 3 hour GTT. Had GDM with first pregnancy. Also needs CBC (not enough blood at 28 week draw)

## 2016-10-21 ENCOUNTER — Other Ambulatory Visit: Payer: 59

## 2016-10-21 DIAGNOSIS — Z130 Encounter for screening for diseases of the blood and blood-forming organs and certain disorders involving the immune mechanism: Secondary | ICD-10-CM

## 2016-10-21 DIAGNOSIS — R7309 Other abnormal glucose: Secondary | ICD-10-CM

## 2016-10-21 NOTE — Addendum Note (Signed)
Addended by: Kathlene CoteSTANLEY, Nazaire Cordial G on: 10/21/2016 09:05 AM   Modules accepted: Orders

## 2016-10-22 ENCOUNTER — Telehealth: Payer: Self-pay | Admitting: Certified Nurse Midwife

## 2016-10-22 DIAGNOSIS — O0992 Supervision of high risk pregnancy, unspecified, second trimester: Secondary | ICD-10-CM

## 2016-10-22 LAB — CBC WITH DIFFERENTIAL/PLATELET
BASOS ABS: 0 10*3/uL (ref 0.0–0.2)
BASOS: 0 %
EOS (ABSOLUTE): 0.1 10*3/uL (ref 0.0–0.4)
Eos: 1 %
Hematocrit: 37.5 % (ref 34.0–46.6)
Hemoglobin: 12.3 g/dL (ref 11.1–15.9)
IMMATURE GRANS (ABS): 0.1 10*3/uL (ref 0.0–0.1)
IMMATURE GRANULOCYTES: 1 %
LYMPHS: 18 %
Lymphocytes Absolute: 1.9 10*3/uL (ref 0.7–3.1)
MCH: 31.1 pg (ref 26.6–33.0)
MCHC: 32.8 g/dL (ref 31.5–35.7)
MCV: 95 fL (ref 79–97)
Monocytes Absolute: 0.4 10*3/uL (ref 0.1–0.9)
Monocytes: 4 %
NEUTROS PCT: 76 %
Neutrophils Absolute: 7.9 10*3/uL — ABNORMAL HIGH (ref 1.4–7.0)
PLATELETS: 202 10*3/uL (ref 150–379)
RBC: 3.96 x10E6/uL (ref 3.77–5.28)
RDW: 13.8 % (ref 12.3–15.4)
WBC: 10.3 10*3/uL (ref 3.4–10.8)

## 2016-10-22 LAB — GESTATIONAL GLUCOSE TOLERANCE
Glucose, Fasting: 85 mg/dL (ref 65–94)
Glucose, GTT - 1 Hour: 210 mg/dL — ABNORMAL HIGH (ref 65–179)
Glucose, GTT - 2 Hour: 192 mg/dL — ABNORMAL HIGH (ref 65–154)
Glucose, GTT - 3 Hour: 128 mg/dL (ref 65–139)

## 2016-10-22 MED ORDER — GLUCOSE BLOOD VI STRP: ORAL_STRIP | 2 refills | Status: DC

## 2016-10-22 MED ORDER — ACCU-CHEK FASTCLIX LANCETS MISC: 1.0000 [IU] | Freq: Four times a day (QID) | 2 refills | Status: DC

## 2016-10-22 NOTE — Telephone Encounter (Signed)
Called patient to inform of abnormal 3 hour GTT. Has GDM this pregnancy. Had GDM with the last pregnancy and still has her Accucheck Aviva glucometer. Will have her check FSBS qid and start log. Referral to [facility] for diet instructions.

## 2016-10-25 ENCOUNTER — Telehealth: Payer: Self-pay

## 2016-10-25 NOTE — Telephone Encounter (Signed)
Pt supposed to get a refill on glucose strips which needs PA b/c ins doesn't cover it.  Pt wondering if it would be eaasier to get another monitor that ins covers or what we could do to remedy having to do the PA.  Please call.  [phone]

## 2016-10-25 NOTE — Telephone Encounter (Signed)
Per pt one touch testing supplies covered by insurance. Contacted pharmacy to update rx for one touch meter, test strips and lancets with directions to check blood sugar QID. Pt aware and appreciative. Just FYI. Thank you.

## 2016-10-26 ENCOUNTER — Other Ambulatory Visit: Payer: Self-pay | Admitting: Certified Nurse Midwife

## 2016-10-26 DIAGNOSIS — O24419 Gestational diabetes mellitus in pregnancy, unspecified control: Secondary | ICD-10-CM

## 2016-10-26 NOTE — Telephone Encounter (Signed)
Thank you :)

## 2016-10-29 ENCOUNTER — Ambulatory Visit (INDEPENDENT_AMBULATORY_CARE_PROVIDER_SITE_OTHER): Payer: 59 | Admitting: Advanced Practice Midwife

## 2016-10-29 VITALS — BP 120/70 | Wt 225.0 lb

## 2016-10-29 DIAGNOSIS — Z23 Encounter for immunization: Secondary | ICD-10-CM | POA: Diagnosis not present

## 2016-10-29 DIAGNOSIS — Z3A30 30 weeks gestation of pregnancy: Secondary | ICD-10-CM

## 2016-10-29 NOTE — Addendum Note (Signed)
Addended by: Cornelius MorasPATTERSON, Dolorez Jeffrey D on: 10/29/2016 10:35 AM   Modules accepted: Orders

## 2016-10-29 NOTE — Progress Notes (Signed)
Doing well with blood glucose control. Numbers are primarily wnl. She has appointment with [facility] soon. No LOF, VB. TDAP today.

## 2016-10-29 NOTE — Patient Instructions (Signed)
Gestational Diabetes Mellitus, Self Care Caring for yourself after you have been diagnosed with gestational diabetes (gestational diabetes mellitus) means keeping your blood sugar (glucose) under control with a balance of:  Nutrition.  Exercise.  Lifestyle changes.  Medicines or insulin, if necessary.  Support from your team of health care providers and others.  The following information explains what you need to know to manage your gestational diabetes at home. What do I need to do to manage my blood glucose?  Check your blood glucose every day during your pregnancy. Do this as often as told by your health care provider.  Contact your health care provider if your blood glucose is above your target for 2 tests in a row. Your health care provider will set individualized treatment goals for you. Generally, the goal of treatment is to maintain the following blood glucose levels during pregnancy:  After not eating for 8 hours (after fasting): at or below 95 mg/dL (5.3 mmol/L).  After meals (postprandial): ? One hour after a meal: at or below 140 mg/dL (7.8 mmol/L). ? Two hours after a meal: at or below 120 mg/dL (6.7 mmol/L).  A1c (hemoglobin A1c) level: 6-6.5%.  What do I need to know about hyperglycemia and hypoglycemia? What is hyperglycemia? Hyperglycemia, also called high blood glucose, occurs when blood glucose is too high. Make sure you know the early signs of hyperglycemia, such as:  Increased thirst.  Hunger.  Feeling very tired.  Needing to urinate more often than usual.  Blurry vision.  What is hypoglycemia? Hypoglycemia, also called low blood glucose, occurswith a blood glucose level at or below 70 mg/dL (3.9 mmol/L). The risk for hypoglycemia increases during or after exercise, during sleep, during illness, and when skipping meals or not eating for a long time (fasting). It is important to know the symptoms of hypoglycemia and treat it right away. Always have a  15-gram rapid-acting carbohydrate snack with you to treat low blood glucose.Family members and close friends should also know the symptoms and should understand how to treat hypoglycemia, in case you are not able to treat yourself. What are the symptoms of hypoglycemia? Hypoglycemia symptoms can include:  Hunger.  Anxiety.  Sweating and feeling clammy.  Confusion.  Dizziness or feeling light-headed.  Sleepiness.  Nausea.  Increased heart rate.  Headache.  Blurry vision.  Seizure.  Nightmares.  Tingling or numbness around the mouth, lips, or tongue.  A change in speech.  Decreased ability to concentrate.  A change in coordination.  Restless sleep.  Tremors or shakes.  Fainting.  Irritability.  How do I treat hypoglycemia?  If you are alert and able to swallow safely, follow the 15:15 rule:  Take 15 grams of a rapid-acting carbohydrate. Rapid-acting options include: ? 1 tube of glucose gel. ? 3 glucose pills. ? 6-8 pieces of hard candy. ? 4 oz (120 mL) of fruit juice. ? 4 oz (120 mL) of regular (not diet) soda.  Check your blood glucose 15 minutes after you take the carbohydrate.  If the repeat blood glucose level is still at or below 70 mg/dL (3.9 mmol/L), take 15 grams of a carbohydrate again.  If your blood glucose level does not increase above 70 mg/dL (3.9 mmol/L) after 3 tries, seek emergency medical care.  After your blood glucose level returns to normal, eat a meal or a snack within 1 hour.  How do I treat severe hypoglycemia? Severe hypoglycemia is when your blood glucose level is at or below 54 mg/dL (  3 mmol/L). Severe hypoglycemia is an emergency. Do not wait to see if the symptoms will go away. Get medical help right away. Call your local emergency services (911 in the [place] Do not drive yourself to the hospital. If you have severe hypoglycemia and you cannot eat or drink, you may need an injection of glucagon. A family member or close  friend should learn how to check your blood glucose and how to give you a glucagon injection. Ask your health care provider if you need to have an emergency glucagon injection kit available. Severe hypoglycemia may need to be treated in a hospital. The treatment may include getting glucose through an IV tube. You may also need treatment for the cause of your hypoglycemia. What else can I do to manage my gestational diabetes? Take your diabetes medicines as told  If your health care provider prescribed insulin or diabetes medicines, take them every day.  Do not run out of insulin or other diabetes medicines that you take. Plan ahead so you always have these available.  If you use insulin, adjust your dosage based on how physically active you are and what foods you eat. Your health care provider will tell you how to adjust your dosage. Make healthy food choices  The things that you eat and drink affect your blood glucose. Making good choices helps to control your diabetes and prevent other health problems. A healthy meal plan includes eating lean proteins, complex carbohydrates, fresh fruits and vegetables, low-fat dairy products, and healthy fats. Make an appointment to see a diet and nutrition specialist (registered dietitian) to help you create an eating plan that is right for you. Make sure that you:  Follow instructions from your health care provider about eating or drinking restrictions.  Drink enough fluid to keep your urine clear or pale yellow.  Eat healthy snacks between nutritious meals.  Track the carbohydrates that you eat. Do this by reading food labels and learning the standard serving sizes of foods.  Follow your sick day plan whenever you cannot eat or drink as usual. Make this plan in advance with your health care provider.  Stay active   Do at least 30 minutes of physical activity a day, or as much physical activity as your health care provider recommends during your  pregnancy. ? Doing 10 minutes of exercise 30 minutes after each meal may help to control postprandial blood glucose levels.  If you start a new exercise or activity, work with your health care provider to adjust your insulin, medicines, or food intake as needed. Make healthy lifestyle choices  Do not drink alcohol.  Do not use any tobacco products, such as cigarettes, chewing tobacco, and e-cigarettes. If you need help quitting, ask your health care provider.  Learn to manage stress. If you need help with this, ask your health care provider. Care for your body  Keep your immunizations up to date.  Brush your teeth and gums two times a day, and floss at least one time a day.  Visit your dentist at least once every 6 months.  Maintain a healthy weight during your pregnancy. General instructions   Take over-the-counter and prescription medicines only as told by your health care provider.  Talk with your health care provider about your risk for high blood pressure during pregnancy (preeclampsia or eclampsia).  Share your diabetes management plan with people in your workplace, school, and household.  Check your urine for ketones during your pregnancy when you are ill and   as told by your health care provider.  Carry a medical alert card or wear medical alert jewelry.  Ask your health care provider: ? Do I need to meet with a diabetes educator? ? Where can I find a support group for people with diabetes?  Keep all follow-up visits during your pregnancy (prenatal) and after delivery (postnatal) as told by your health care provider. This is important. Get the care that you need after delivery  Have your blood glucose level checked 4-12 weeks after delivery. This is done with an oral glucose tolerance test (OGTT).  Get screened for diabetes at least every 3 years, or as often as told by your health care provider. Where to find more information: To learn more about gestational  diabetes, visit:  American Diabetes Association (ADA): www.diabetes.org/diabetes-basics/gestational  Centers for Disease Control and Prevention (CDC): http://sanchez-watson.com/.pdf  This information is not intended to replace advice given to you by your health care provider. Make sure you discuss any questions you have with your health care provider. Document Released: 08/11/2015 Document Revised: 09/25/2015 Document Reviewed: 05/23/2015 Elsevier Interactive Patient Education  2017 [employer].

## 2016-11-01 ENCOUNTER — Encounter: Payer: Self-pay | Admitting: Dietician

## 2016-11-01 ENCOUNTER — Encounter: Payer: 59 | Attending: Certified Nurse Midwife | Admitting: Dietician

## 2016-11-01 VITALS — BP 106/60 | Ht 63.0 in | Wt 224.2 lb

## 2016-11-01 DIAGNOSIS — Z713 Dietary counseling and surveillance: Secondary | ICD-10-CM | POA: Insufficient documentation

## 2016-11-01 DIAGNOSIS — O0993 Supervision of high risk pregnancy, unspecified, third trimester: Secondary | ICD-10-CM | POA: Diagnosis not present

## 2016-11-01 DIAGNOSIS — O24410 Gestational diabetes mellitus in pregnancy, diet controlled: Secondary | ICD-10-CM | POA: Diagnosis present

## 2016-11-01 DIAGNOSIS — O99213 Obesity complicating pregnancy, third trimester: Secondary | ICD-10-CM | POA: Insufficient documentation

## 2016-11-01 DIAGNOSIS — Z3A00 Weeks of gestation of pregnancy not specified: Secondary | ICD-10-CM | POA: Diagnosis not present

## 2016-11-01 DIAGNOSIS — Z6841 Body Mass Index (BMI) 40.0 and over, adult: Secondary | ICD-10-CM | POA: Diagnosis not present

## 2016-11-01 DIAGNOSIS — E669 Obesity, unspecified: Secondary | ICD-10-CM | POA: Insufficient documentation

## 2016-11-01 NOTE — Patient Instructions (Signed)
Read booklet on Gestational Diabetes Follow Gestational Meal Planning Guidelines Complete a 3 Day Food Record and bring to next appointment Check blood sugars 4 x day - before breakfast and 2 hrs after every meal and record  Bring blood sugar log to all appointments Continue to check urine ketones every am:  If + increase bedtime snack to 1 protein and 2 carbohydrate servings Walk 20-30 minutes at least 5 x week if permitted by MD Next appointment    11-08-16

## 2016-11-01 NOTE — Progress Notes (Signed)
Appt. Start Time:0900 Appt. End Time: 1030  GDM Class 1 Diabetes Overview - define DM; state own type of DM; identify functions of pancreas and insulin; define insulin deficiency vs insulin resistance  Psychosocial - identify DM as a source of stress; state the effects of stress on BG control; verbalize appropriate stress management techniques; identify personal stress issues   Nutritional Management - describe effects of food on blood glucose; identify sources of carbohydrate, protein and fat; verbalize the importance of balance meals in controlling blood glucose; identify meals as well balanced or not; estimate servings of carbohydrate from menus; use food labels to identify servings size, content of carbohydrate, fiber, protein, fat, saturated fat and sodium; recognize food sources of fat, saturated fat, trans fat, sodium and verbalize goals for intake; describe healthful appropriate food choices when dining out   Exercise - describe the effects of exercise on blood glucose and importance of regular exercise in controlling diabetes; state a plan for personal exercise; verbalize contraindications for exercise  Medications - state name, dose, timing of currently prescribed medications; describe types of medications available for diabetes  Insulin Training - prepare and administer insulin accurately; state correct rotation pattern; verbalize safe and lawful needle disposal  Self-Monitoring - state importance of HBGM and demo procedure accurately; use HBGM results to effectively manage diabetes; identify importance of regular HbA1C testing and goals for results  Acute Complications/Sick Day Guidelines - recognize hyperglycemia and hypoglycemia with causes and effects; identify blood glucose results as high, low or in control; list steps in treating and preventing high and low blood glucose; state appropriate measure to manage blood glucose when ill (need for meds, HBGM plan, when to call physician,  need for fluids)  Chronic Complications/Foot, Skin, Eye Dental Care - identify possible long-term complications of diabetes (retinopathy, neuropathy, nephropathy, cardiovascular disease, infections); explain steps in prevention and treatment of chronic complications; state importance of daily self-foot exams; describe how to examine feet and what to look for; explain appropriate eye and dental care  Lifestyle Changes/Goals & Health/Community Resources - state benefits of making appropriate lifestyle changes; identify habits that need to change (meals, tobacco, alcohol); identify strategies to reduce risk factors for personal health; set goals for proper diabetes care; state need for and frequency of healthcare follow-up; describe appropriate community resources for good health (ADA, web sites, apps)   Pregnancy/Sexual Health - define gestational diabetes; state importance of good blood glucose control and birth control prior to pregnancy; state importance of good blood glucose control in preventing sexual problems (impotence, vaginal dryness, infections, loss of desire); state relationship of blood glucose control and pregnancy outcome; describe risk of maternal and fetal complications  Teaching Materials Used: Pt already has Ultra One Touch Mini meter General Meal Planning Guidelines Daily Food Record Gestational Diabetes Booklet Gestational Video Goals for Healthy Pregnancy

## 2016-11-08 ENCOUNTER — Encounter: Payer: 59 | Admitting: Dietician

## 2016-11-08 ENCOUNTER — Encounter: Payer: Self-pay | Admitting: Dietician

## 2016-11-08 VITALS — BP 102/64 | Ht 63.0 in | Wt 223.2 lb

## 2016-11-08 DIAGNOSIS — O24410 Gestational diabetes mellitus in pregnancy, diet controlled: Secondary | ICD-10-CM | POA: Diagnosis not present

## 2016-11-08 NOTE — Patient Instructions (Signed)
   Work with bedtime snack to see if fasting BGs improve without 3am snack. Try adding high fiber food such as whole grain crackers like Triscuits, popcorn, avocado (also has protein and fat), mini wheats cereal. You can also increase the amount of protein with this snack.  Keep up your healthy food choices and balanced meals, great job!

## 2016-11-08 NOTE — Progress Notes (Signed)
   Patient's BG record indicates fasting BGs are slightly elevated, ranging 90-104; and post-meal BGs generally within goals, ranging 103-124. She reports better fasting BGs if she wakes up during the night (2-3am) and eats a snack.   Patient's food diary indicates balanced meals and healthy food choices.    Provided 1800kcal meal plan, and wrote individualized menus based on patient's food preferences. Discussed options for changing bedtime snack to improve fasting BGs and avoid waking up during the night.  Instructed patient on food safety, including avoidance of Listeriosis, and limiting mercury from fish.  Discussed importance of maintaining healthy lifestyle habits to reduce risk of Type 2 DM as well as Gestational DM with any future pregnancies.  Advised patient to use any remaining testing supplies to test some BGs after delivery, and to have BG tested ideally annually, as well as prior to attempting future pregnancies.

## 2016-11-12 ENCOUNTER — Ambulatory Visit (INDEPENDENT_AMBULATORY_CARE_PROVIDER_SITE_OTHER): Payer: 59 | Admitting: Obstetrics and Gynecology

## 2016-11-12 VITALS — BP 126/74 | Wt 225.0 lb

## 2016-11-12 DIAGNOSIS — O0993 Supervision of high risk pregnancy, unspecified, third trimester: Secondary | ICD-10-CM

## 2016-11-12 DIAGNOSIS — O09893 Supervision of other high risk pregnancies, third trimester: Secondary | ICD-10-CM

## 2016-11-12 DIAGNOSIS — Z3A32 32 weeks gestation of pregnancy: Secondary | ICD-10-CM

## 2016-11-12 DIAGNOSIS — Z6791 Unspecified blood type, Rh negative: Secondary | ICD-10-CM

## 2016-11-12 DIAGNOSIS — Z6836 Body mass index (BMI) 36.0-36.9, adult: Secondary | ICD-10-CM

## 2016-11-12 DIAGNOSIS — O24410 Gestational diabetes mellitus in pregnancy, diet controlled: Secondary | ICD-10-CM

## 2016-11-12 DIAGNOSIS — O26893 Other specified pregnancy related conditions, third trimester: Secondary | ICD-10-CM

## 2016-11-12 NOTE — Progress Notes (Signed)
Prenatal Visit Note Date: 11/12/2016 Clinic: [facility]  Subjective:  Shawna Travis is a 31 y.o. G3P1011 at 6835w3d being seen today for ongoing prenatal care.  She is currently monitored for the following issues for this high-risk pregnancy and has Supervision of high risk pregnancy, antepartum, third trimester; Obesity complicating pregnancy, third trimester; BMI 36.0-36.9,adult; History of gestational diabetes; Rh negative state in antepartum period, third trimester; and Gestational diabetes on her problem list.  Patient reports no bleeding, no contractions, no leaking and occasional cramping.   Contractions: Not present. Vag. Bleeding: None.  Movement: Present. Denies leaking of fluid.   The following portions of the patient's history were reviewed and updated as appropriate: allergies, current medications, past family history, past medical history, past social history, past surgical history and problem list. Problem list updated.  Objective:   Vitals:   11/12/16 1104  BP: 126/74  Weight: 225 lb (102.1 kg)    Fetal Status:     Movement: Present     General:  Alert, oriented and cooperative. Patient is in no acute distress.  Skin: Skin is warm and dry. No rash noted.   Cardiovascular: Normal heart rate noted  Respiratory: Normal respiratory effort, no problems with respiration noted  Abdomen: Soft, gravid, appropriate for gestational age. Pain/Pressure: Present     Pelvic:  Cervical exam deferred        Extremities: Normal range of motion.     Mental Status: Normal mood and affect. Normal behavior. Normal judgment and thought content.   Urinalysis: Urine Protein: Negative Urine Glucose: Negative  Assessment and Plan:  Pregnancy: G3P1011 at 2135w3d  1. Diet controlled gestational diabetes mellitus (GDM) in third trimester BG log today > 50% of values normal (5/11 values slightly abnormal) Nearly all 2h pp normal Continue current management and keep in contact with  nutritionist  2. BMI 36.0-36.9,adult TWG 13 pounds so far  3. Rh negative state in antepartum period, third trimester S/p rhogam 10/15/16  Preterm labor symptoms and general obstetric precautions including but not limited to vaginal bleeding, contractions, leaking of fluid and fetal movement were reviewed in detail with the patient. Please refer to After Visit Summary for other counseling recommendations.  Return in about 2 weeks (around 11/26/2016).  Thomasene MohairStephen Jadore Mcguffin, MD 11/12/2016 11:42 AM

## 2016-11-26 ENCOUNTER — Encounter: Payer: Self-pay | Admitting: Obstetrics and Gynecology

## 2016-11-26 ENCOUNTER — Ambulatory Visit (INDEPENDENT_AMBULATORY_CARE_PROVIDER_SITE_OTHER): Payer: 59 | Admitting: Obstetrics and Gynecology

## 2016-11-26 VITALS — BP 108/68 | Wt 228.0 lb

## 2016-11-26 DIAGNOSIS — O09893 Supervision of other high risk pregnancies, third trimester: Secondary | ICD-10-CM

## 2016-11-26 DIAGNOSIS — O0993 Supervision of high risk pregnancy, unspecified, third trimester: Secondary | ICD-10-CM

## 2016-11-26 DIAGNOSIS — Z6841 Body Mass Index (BMI) 40.0 and over, adult: Secondary | ICD-10-CM

## 2016-11-26 DIAGNOSIS — Z6791 Unspecified blood type, Rh negative: Secondary | ICD-10-CM

## 2016-11-26 DIAGNOSIS — O24410 Gestational diabetes mellitus in pregnancy, diet controlled: Secondary | ICD-10-CM

## 2016-11-26 DIAGNOSIS — O99213 Obesity complicating pregnancy, third trimester: Secondary | ICD-10-CM

## 2016-11-26 DIAGNOSIS — O26893 Other specified pregnancy related conditions, third trimester: Secondary | ICD-10-CM

## 2016-11-26 NOTE — Progress Notes (Signed)
FKC sheet given

## 2016-11-26 NOTE — Progress Notes (Signed)
Growth scan next visit Start twice weekly fetal surveilance at 36 weeks BG at goal a single 2-hr postprandial breakfast at 125

## 2016-12-09 ENCOUNTER — Ambulatory Visit (INDEPENDENT_AMBULATORY_CARE_PROVIDER_SITE_OTHER): Payer: 59

## 2016-12-09 ENCOUNTER — Ambulatory Visit (INDEPENDENT_AMBULATORY_CARE_PROVIDER_SITE_OTHER): Payer: 59 | Admitting: Advanced Practice Midwife

## 2016-12-09 ENCOUNTER — Encounter: Payer: Self-pay | Admitting: Advanced Practice Midwife

## 2016-12-09 VITALS — BP 118/76 | Wt 226.0 lb

## 2016-12-09 DIAGNOSIS — O24410 Gestational diabetes mellitus in pregnancy, diet controlled: Secondary | ICD-10-CM | POA: Diagnosis not present

## 2016-12-09 DIAGNOSIS — Z113 Encounter for screening for infections with a predominantly sexual mode of transmission: Secondary | ICD-10-CM

## 2016-12-09 DIAGNOSIS — O99213 Obesity complicating pregnancy, third trimester: Secondary | ICD-10-CM | POA: Diagnosis not present

## 2016-12-09 DIAGNOSIS — O0993 Supervision of high risk pregnancy, unspecified, third trimester: Secondary | ICD-10-CM

## 2016-12-09 DIAGNOSIS — Z6841 Body Mass Index (BMI) 40.0 and over, adult: Secondary | ICD-10-CM | POA: Diagnosis not present

## 2016-12-09 DIAGNOSIS — Z3A36 36 weeks gestation of pregnancy: Secondary | ICD-10-CM

## 2016-12-09 DIAGNOSIS — Z3685 Encounter for antenatal screening for Streptococcus B: Secondary | ICD-10-CM

## 2016-12-09 NOTE — Progress Notes (Signed)
GBS/Aptima today. No vb. No lof, NST

## 2016-12-09 NOTE — Progress Notes (Signed)
Growth scan today 60%, 6 pounds 11 ounces, AC 89%, AFI 13.51 NST reactive: 130 bpm baseline, moderate variability, + accelerations, - decelerations Blood sugars are all in normal range GBS/Aptima today NST twice weekly AFI once weekly Having some pelvic pressure, irregular contractions, low back pain

## 2016-12-11 LAB — GC/CHLAMYDIA PROBE AMP
Chlamydia trachomatis, NAA: NEGATIVE
NEISSERIA GONORRHOEAE BY PCR: NEGATIVE

## 2016-12-11 LAB — STREP GP B NAA: Strep Gp B NAA: NEGATIVE

## 2016-12-13 ENCOUNTER — Ambulatory Visit (INDEPENDENT_AMBULATORY_CARE_PROVIDER_SITE_OTHER): Payer: 59 | Admitting: Obstetrics & Gynecology

## 2016-12-13 VITALS — BP 112/60 | Wt 226.0 lb

## 2016-12-13 DIAGNOSIS — Z8632 Personal history of gestational diabetes: Secondary | ICD-10-CM

## 2016-12-13 DIAGNOSIS — O0993 Supervision of high risk pregnancy, unspecified, third trimester: Secondary | ICD-10-CM

## 2016-12-13 DIAGNOSIS — Z3A36 36 weeks gestation of pregnancy: Secondary | ICD-10-CM

## 2016-12-13 DIAGNOSIS — Z6841 Body Mass Index (BMI) 40.0 and over, adult: Secondary | ICD-10-CM

## 2016-12-13 NOTE — Progress Notes (Signed)
GDM- well controlled w diet, cont to monitor    A NST procedure was performed with FHR monitoring and a normal baseline established, appropriate time of 20-40 minutes of evaluation, and accels >2 seen w 15x15 characteristics.  Results show a REACTIVE NST.     NST and AFI Friday planned Obesity RF discussed. Labor precautions GBS neg noted.

## 2016-12-17 ENCOUNTER — Ambulatory Visit (INDEPENDENT_AMBULATORY_CARE_PROVIDER_SITE_OTHER): Payer: 59 | Admitting: Obstetrics & Gynecology

## 2016-12-17 ENCOUNTER — Ambulatory Visit (INDEPENDENT_AMBULATORY_CARE_PROVIDER_SITE_OTHER): Payer: 59

## 2016-12-17 VITALS — BP 110/70 | Wt 227.0 lb

## 2016-12-17 DIAGNOSIS — Z8632 Personal history of gestational diabetes: Secondary | ICD-10-CM

## 2016-12-17 DIAGNOSIS — Z362 Encounter for other antenatal screening follow-up: Secondary | ICD-10-CM

## 2016-12-17 DIAGNOSIS — O99213 Obesity complicating pregnancy, third trimester: Secondary | ICD-10-CM

## 2016-12-17 DIAGNOSIS — Z3A37 37 weeks gestation of pregnancy: Secondary | ICD-10-CM

## 2016-12-17 DIAGNOSIS — O24410 Gestational diabetes mellitus in pregnancy, diet controlled: Secondary | ICD-10-CM

## 2016-12-17 DIAGNOSIS — O0993 Supervision of high risk pregnancy, unspecified, third trimester: Secondary | ICD-10-CM | POA: Diagnosis not present

## 2016-12-17 NOTE — Progress Notes (Signed)
GDM- well controlled w diet, cont to monitor    A NST procedure was performed with FHR monitoring and a normal baseline established, appropriate time of 20-40 minutes of evaluation, and accels >2 seen w 15x15 characteristics.  Results show a REACTIVE NST.     An US done today and weekly for AFI.  AFI 14 today. Obesity RF discussed. Labor precautions

## 2016-12-21 ENCOUNTER — Ambulatory Visit (INDEPENDENT_AMBULATORY_CARE_PROVIDER_SITE_OTHER): Payer: 59 | Admitting: Certified Nurse Midwife

## 2016-12-21 ENCOUNTER — Encounter: Payer: Self-pay | Admitting: Certified Nurse Midwife

## 2016-12-21 ENCOUNTER — Encounter: Payer: 59 | Admitting: Obstetrics and Gynecology

## 2016-12-21 VITALS — BP 110/70 | Wt 225.0 lb

## 2016-12-21 DIAGNOSIS — Z3A38 38 weeks gestation of pregnancy: Secondary | ICD-10-CM

## 2016-12-21 DIAGNOSIS — O24410 Gestational diabetes mellitus in pregnancy, diet controlled: Secondary | ICD-10-CM | POA: Diagnosis not present

## 2016-12-21 DIAGNOSIS — O99210 Obesity complicating pregnancy, unspecified trimester: Secondary | ICD-10-CM

## 2016-12-21 DIAGNOSIS — O0993 Supervision of high risk pregnancy, unspecified, third trimester: Secondary | ICD-10-CM

## 2016-12-21 NOTE — Progress Notes (Signed)
Pt reports no problems. NST today.

## 2016-12-21 NOTE — Progress Notes (Signed)
GDMA1 well controlled on diet. FBS in 80s, 2hr pp 108-115 per patient recall NST today reactive with baseline 140 and accelerations to 160s to 170, moderate variability Irregular contractions, no VB NST and growth scan in 1 week Labor precautions

## 2016-12-22 ENCOUNTER — Encounter: Payer: 59 | Admitting: Certified Nurse Midwife

## 2016-12-24 ENCOUNTER — Encounter: Payer: 59 | Admitting: Certified Nurse Midwife

## 2016-12-24 ENCOUNTER — Other Ambulatory Visit: Payer: 59

## 2016-12-28 ENCOUNTER — Ambulatory Visit (INDEPENDENT_AMBULATORY_CARE_PROVIDER_SITE_OTHER): Payer: 59

## 2016-12-28 ENCOUNTER — Ambulatory Visit (INDEPENDENT_AMBULATORY_CARE_PROVIDER_SITE_OTHER): Payer: 59 | Admitting: Maternal Newborn

## 2016-12-28 VITALS — BP 112/66 | Wt 227.0 lb

## 2016-12-28 DIAGNOSIS — O0993 Supervision of high risk pregnancy, unspecified, third trimester: Secondary | ICD-10-CM | POA: Diagnosis not present

## 2016-12-28 DIAGNOSIS — O24410 Gestational diabetes mellitus in pregnancy, diet controlled: Secondary | ICD-10-CM

## 2016-12-28 DIAGNOSIS — O99210 Obesity complicating pregnancy, unspecified trimester: Secondary | ICD-10-CM

## 2016-12-28 DIAGNOSIS — Z3A39 39 weeks gestation of pregnancy: Secondary | ICD-10-CM

## 2016-12-28 NOTE — Patient Instructions (Signed)
Third Trimester of Pregnancy The third trimester is from week 28 through week 40 (months 7 through 9). The third trimester is a time when the unborn baby (fetus) is growing rapidly. At the end of the ninth month, the fetus is about 20 inches in length and weighs 6-10 pounds. Body changes during your third trimester Your body will continue to go through many changes during pregnancy. The changes vary from woman to woman. During the third trimester:  Your weight will continue to increase. You can expect to gain 25-35 pounds (11-16 kg) by the end of the pregnancy.  You may begin to get stretch marks on your hips, abdomen, and breasts.  You may urinate more often because the fetus is moving lower into your pelvis and pressing on your bladder.  You may develop or continue to have heartburn. This is caused by increased hormones that slow down muscles in the digestive tract.  You may develop or continue to have constipation because increased hormones slow digestion and cause the muscles that push waste through your intestines to relax.  You may develop hemorrhoids. These are swollen veins (varicose veins) in the rectum that can itch or be painful.  You may develop swollen, bulging veins (varicose veins) in your legs.  You may have increased body aches in the pelvis, back, or thighs. This is due to weight gain and increased hormones that are relaxing your joints.  You may have changes in your hair. These can include thickening of your hair, rapid growth, and changes in texture. Some women also have hair loss during or after pregnancy, or hair that feels dry or thin. Your hair will most likely return to normal after your baby is born.  Your breasts will continue to grow and they will continue to become tender. A yellow fluid (colostrum) may leak from your breasts. This is the first milk you are producing for your baby.  Your belly button may stick out.  You may notice more swelling in your hands,  face, or ankles.  You may have increased tingling or numbness in your hands, arms, and legs. The skin on your belly may also feel numb.  You may feel short of breath because of your expanding uterus.  You may have more problems sleeping. This can be caused by the size of your belly, increased need to urinate, and an increase in your body's metabolism.  You may notice the fetus "dropping," or moving lower in your abdomen (lightening).  You may have increased vaginal discharge.  You may notice your joints feel loose and you may have pain around your pelvic bone.  What to expect at prenatal visits You will have prenatal exams every 2 weeks until week 36. Then you will have weekly prenatal exams. During a routine prenatal visit:  You will be weighed to make sure you and the baby are growing normally.  Your blood pressure will be taken.  Your abdomen will be measured to track your baby's growth.  The fetal heartbeat will be listened to.  Any test results from the previous visit will be discussed.  You may have a cervical check near your due date to see if your cervix has softened or thinned (effaced).  You will be tested for Group B streptococcus. This happens between 35 and 37 weeks.  Your health care provider may ask you:  What your birth plan is.  How you are feeling.  If you are feeling the baby move.  If you have had   any abnormal symptoms, such as leaking fluid, bleeding, severe headaches, or abdominal cramping.  If you are using any tobacco products, including cigarettes, chewing tobacco, and electronic cigarettes.  If you have any questions.  Other tests or screenings that may be performed during your third trimester include:  Blood tests that check for low iron levels (anemia).  Fetal testing to check the health, activity level, and growth of the fetus. Testing is done if you have certain medical conditions or if there are problems during the  pregnancy.  Nonstress test (NST). This test checks the health of your baby to make sure there are no signs of problems, such as the baby not getting enough oxygen. During this test, a belt is placed around your belly. The baby is made to move, and its heart rate is monitored during movement.  What is false labor? False labor is a condition in which you feel small, irregular tightenings of the muscles in the womb (contractions) that usually go away with rest, changing position, or drinking water. These are called Braxton Hicks contractions. Contractions may last for hours, days, or even weeks before true labor sets in. If contractions come at regular intervals, become more frequent, increase in intensity, or become painful, you should see your health care provider. What are the signs of labor?  Abdominal cramps.  Regular contractions that start at 10 minutes apart and become stronger and more frequent with time.  Contractions that start on the top of the uterus and spread down to the lower abdomen and back.  Increased pelvic pressure and dull back pain.  A watery or bloody mucus discharge that comes from the vagina.  Leaking of amniotic fluid. This is also known as your "water breaking." It could be a slow trickle or a gush. Let your health care provider know if it has a color or strange odor. If you have any of these signs, call your health care provider right away, even if it is before your due date. Follow these instructions at home: Medicines  Follow your health care provider's instructions regarding medicine use. Specific medicines may be either safe or unsafe to take during pregnancy.  Take a prenatal vitamin that contains at least 600 micrograms (mcg) of folic acid.  If you develop constipation, try taking a stool softener if your health care provider approves. Eating and drinking  Eat a balanced diet that includes fresh fruits and vegetables, whole grains, good sources of protein  such as meat, eggs, or tofu, and low-fat dairy. Your health care provider will help you determine the amount of weight gain that is right for you.  Avoid raw meat and uncooked cheese. These carry germs that can cause birth defects in the baby.  If you have low calcium intake from food, talk to your health care provider about whether you should take a daily calcium supplement.  Eat four or five small meals rather than three large meals a day.  Limit foods that are high in fat and processed sugars, such as fried and sweet foods.  To prevent constipation: ? Drink enough fluid to keep your urine clear or pale yellow. ? Eat foods that are high in fiber, such as fresh fruits and vegetables, whole grains, and beans. Activity  Exercise only as directed by your health care provider. Most women can continue their usual exercise routine during pregnancy. Try to exercise for 30 minutes at least 5 days a week. Stop exercising if you experience uterine contractions.  Avoid heavy   lifting.  Do not exercise in extreme heat or humidity, or at high altitudes.  Wear low-heel, comfortable shoes.  Practice good posture.  You may continue to have sex unless your health care provider tells you otherwise. Relieving pain and discomfort  Take frequent breaks and rest with your legs elevated if you have leg cramps or low back pain.  Take warm sitz baths to soothe any pain or discomfort caused by hemorrhoids. Use hemorrhoid cream if your health care provider approves.  Wear a good support bra to prevent discomfort from breast tenderness.  If you develop varicose veins: ? Wear support pantyhose or compression stockings as told by your healthcare provider. ? Elevate your feet for 15 minutes, 3-4 times a day. Prenatal care  Write down your questions. Take them to your prenatal visits.  Keep all your prenatal visits as told by your health care provider. This is important. Safety  Wear your seat belt at  all times when driving.  Make a list of emergency phone numbers, including numbers for family, friends, the hospital, and police and fire departments. General instructions  Avoid cat litter boxes and soil used by cats. These carry germs that can cause birth defects in the baby. If you have a cat, ask someone to clean the litter box for you.  Do not travel far distances unless it is absolutely necessary and only with the approval of your health care provider.  Do not use hot tubs, steam rooms, or saunas.  Do not drink alcohol.  Do not use any products that contain nicotine or tobacco, such as cigarettes and e-cigarettes. If you need help quitting, ask your health care provider.  Do not use any medicinal herbs or unprescribed drugs. These chemicals affect the formation and growth of the baby.  Do not douche or use tampons or scented sanitary pads.  Do not cross your legs for long periods of time.  To prepare for the arrival of your baby: ? Take prenatal classes to understand, practice, and ask questions about labor and delivery. ? Make a trial run to the hospital. ? Visit the hospital and tour the maternity area. ? Arrange for maternity or paternity leave through employers. ? Arrange for family and friends to take care of pets while you are in the hospital. ? Purchase a rear-facing car seat and make sure you know how to install it in your car. ? Pack your hospital bag. ? Prepare the baby's nursery. Make sure to remove all pillows and stuffed animals from the baby's crib to prevent suffocation.  Visit your dentist if you have not gone during your pregnancy. Use a soft toothbrush to brush your teeth and be gentle when you floss. Contact a health care provider if:  You are unsure if you are in labor or if your water has broken.  You become dizzy.  You have mild pelvic cramps, pelvic pressure, or nagging pain in your abdominal area.  You have lower back pain.  You have persistent  nausea, vomiting, or diarrhea.  You have an unusual or bad smelling vaginal discharge.  You have pain when you urinate. Get help right away if:  Your water breaks before 37 weeks.  You have regular contractions less than 5 minutes apart before 37 weeks.  You have a fever.  You are leaking fluid from your vagina.  You have spotting or bleeding from your vagina.  You have severe abdominal pain or cramping.  You have rapid weight loss or weight gain.    You have shortness of breath with chest pain.  You notice sudden or extreme swelling of your face, hands, ankles, feet, or legs.  Your baby makes fewer than 10 movements in 2 hours.  You have severe headaches that do not go away when you take medicine.  You have vision changes. Summary  The third trimester is from week 28 through week 40, months 7 through 9. The third trimester is a time when the unborn baby (fetus) is growing rapidly.  During the third trimester, your discomfort may increase as you and your baby continue to gain weight. You may have abdominal, leg, and back pain, sleeping problems, and an increased need to urinate.  During the third trimester your breasts will keep growing and they will continue to become tender. A yellow fluid (colostrum) may leak from your breasts. This is the first milk you are producing for your baby.  False labor is a condition in which you feel small, irregular tightenings of the muscles in the womb (contractions) that eventually go away. These are called Braxton Hicks contractions. Contractions may last for hours, days, or even weeks before true labor sets in.  Signs of labor can include: abdominal cramps; regular contractions that start at 10 minutes apart and become stronger and more frequent with time; watery or bloody mucus discharge that comes from the vagina; increased pelvic pressure and dull back pain; and leaking of amniotic fluid. This information is not intended to replace advice  given to you by your health care provider. Make sure you discuss any questions you have with your health care provider. Document Released: 04/13/2001 Document Revised: 09/25/2015 Document Reviewed: 06/20/2012 Elsevier Interactive Patient Education  2017 [employer]  

## 2016-12-28 NOTE — Progress Notes (Signed)
GDM well controlled with diet; fasting sugars around 85 and post-prandial 110-112. AFI = 9 today NST today: Baseline: 135  Variability: moderate Accelerations: present Decelerations: absent Tocometry: none The patient was monitored for 30 minutes, fetal heart rate tracing was deemed reactive, category I tracing. NST and AFI in one week. Discussed labor precautions.

## 2016-12-29 ENCOUNTER — Other Ambulatory Visit: Payer: Self-pay | Admitting: Certified Nurse Midwife

## 2016-12-29 ENCOUNTER — Encounter: Payer: Self-pay | Admitting: Obstetrics and Gynecology

## 2016-12-29 ENCOUNTER — Inpatient Hospital Stay: Payer: 59 | Admitting: Anesthesiology

## 2016-12-29 ENCOUNTER — Inpatient Hospital Stay
Admission: EM | Admit: 2016-12-29 | Discharge: 2016-12-31 | DRG: 775 | Disposition: A | Discharge status: Home / Self Care | Payer: 59 | Attending: Obstetrics and Gynecology | Admitting: Obstetrics and Gynecology

## 2016-12-29 DIAGNOSIS — Z6791 Unspecified blood type, Rh negative: Secondary | ICD-10-CM | POA: Diagnosis not present

## 2016-12-29 DIAGNOSIS — Z6841 Body Mass Index (BMI) 40.0 and over, adult: Secondary | ICD-10-CM

## 2016-12-29 DIAGNOSIS — O26893 Other specified pregnancy related conditions, third trimester: Secondary | ICD-10-CM | POA: Diagnosis present

## 2016-12-29 DIAGNOSIS — O99214 Obesity complicating childbirth: Secondary | ICD-10-CM | POA: Diagnosis present

## 2016-12-29 DIAGNOSIS — O24420 Gestational diabetes mellitus in childbirth, diet controlled: Secondary | ICD-10-CM | POA: Diagnosis present

## 2016-12-29 DIAGNOSIS — O24410 Gestational diabetes mellitus in pregnancy, diet controlled: Secondary | ICD-10-CM

## 2016-12-29 DIAGNOSIS — Z8632 Personal history of gestational diabetes: Secondary | ICD-10-CM

## 2016-12-29 DIAGNOSIS — Z3A39 39 weeks gestation of pregnancy: Secondary | ICD-10-CM

## 2016-12-29 DIAGNOSIS — E669 Obesity, unspecified: Secondary | ICD-10-CM | POA: Diagnosis present

## 2016-12-29 DIAGNOSIS — O99213 Obesity complicating pregnancy, third trimester: Secondary | ICD-10-CM | POA: Diagnosis present

## 2016-12-29 DIAGNOSIS — O0993 Supervision of high risk pregnancy, unspecified, third trimester: Secondary | ICD-10-CM

## 2016-12-29 DIAGNOSIS — Z87891 Personal history of nicotine dependence: Secondary | ICD-10-CM | POA: Diagnosis not present

## 2016-12-29 DIAGNOSIS — O24419 Gestational diabetes mellitus in pregnancy, unspecified control: Secondary | ICD-10-CM | POA: Diagnosis present

## 2016-12-29 LAB — TYPE AND SCREEN
ABO/RH(D): O NEG
Antibody Screen: POSITIVE

## 2016-12-29 LAB — CBC
HEMATOCRIT: 40.1 % (ref 35.0–47.0)
HEMOGLOBIN: 14.1 g/dL (ref 12.0–16.0)
MCH: 32.9 pg (ref 26.0–34.0)
MCHC: 35.2 g/dL (ref 32.0–36.0)
MCV: 93.4 fL (ref 80.0–100.0)
Platelets: 208 10*3/uL (ref 150–440)
RBC: 4.29 MIL/uL (ref 3.80–5.20)
RDW: 13.7 % (ref 11.5–14.5)
WBC: 11.3 10*3/uL — ABNORMAL HIGH (ref 3.6–11.0)

## 2016-12-29 MED ORDER — FENTANYL 2.5 MCG/ML W/ROPIVACAINE 0.15% IN NS 100 ML EPIDURAL (ARMC)
EPIDURAL | Status: DC | PRN
  Administered 2016-12-29: 10 mL/h via EPIDURAL

## 2016-12-29 MED ORDER — LIDOCAINE HCL (PF) 1 % IJ SOLN
30.0000 mL | INTRAMUSCULAR | Status: DC | PRN
  Filled 2016-12-29: qty 30

## 2016-12-29 MED ORDER — PRENATAL MULTIVITAMIN CH
1.0000 | ORAL_TABLET | Freq: Every day | ORAL | Status: DC
  Administered 2016-12-30 – 2016-12-31 (×2): 1 via ORAL
  Filled 2016-12-29 (×2): qty 1

## 2016-12-29 MED ORDER — SENNOSIDES-DOCUSATE SODIUM 8.6-50 MG PO TABS
2.0000 | ORAL_TABLET | ORAL | Status: DC
  Administered 2016-12-30 – 2016-12-31 (×2): 2 via ORAL
  Filled 2016-12-29 (×2): qty 2

## 2016-12-29 MED ORDER — FENTANYL 2.5 MCG/ML W/ROPIVACAINE 0.15% IN NS 100 ML EPIDURAL (ARMC)
EPIDURAL | Status: AC
  Filled 2016-12-29: qty 100

## 2016-12-29 MED ORDER — BENZOCAINE-MENTHOL 20-0.5 % EX AERO
1.0000 "application " | INHALATION_SPRAY | CUTANEOUS | Status: DC | PRN
  Administered 2016-12-29: 1 via TOPICAL
  Filled 2016-12-29: qty 56

## 2016-12-29 MED ORDER — OXYTOCIN 10 UNIT/ML IJ SOLN: 10.0000 [IU] | Freq: Once | INTRAMUSCULAR | Status: DC

## 2016-12-29 MED ORDER — ONDANSETRON HCL 4 MG PO TABS: 4.0000 mg | ORAL_TABLET | ORAL | Status: DC | PRN

## 2016-12-29 MED ORDER — BUPIVACAINE HCL (PF) 0.25 % IJ SOLN
INTRAMUSCULAR | Status: DC | PRN
  Administered 2016-12-29: 5 mL via EPIDURAL

## 2016-12-29 MED ORDER — ACETAMINOPHEN 325 MG PO TABS
650.0000 mg | ORAL_TABLET | ORAL | Status: DC | PRN
Start: 2016-12-29 — End: 2016-12-31
  Administered 2016-12-30: 650 mg via ORAL
  Filled 2016-12-29: qty 2

## 2016-12-29 MED ORDER — LACTATED RINGERS IV SOLN
500.0000 mL | INTRAVENOUS | Status: DC | PRN
  Administered 2016-12-29: 1000 mL via INTRAVENOUS

## 2016-12-29 MED ORDER — HYDROCODONE-ACETAMINOPHEN 5-325 MG PO TABS: 1.0000 | ORAL_TABLET | Freq: Four times a day (QID) | ORAL | Status: DC | PRN

## 2016-12-29 MED ORDER — WITCH HAZEL-GLYCERIN EX PADS: 1.0000 "application " | MEDICATED_PAD | CUTANEOUS | Status: DC | PRN

## 2016-12-29 MED ORDER — OXYTOCIN BOLUS FROM INFUSION: 500.0000 mL | Freq: Once | INTRAVENOUS | Status: DC

## 2016-12-29 MED ORDER — ONDANSETRON HCL 4 MG/2ML IJ SOLN: 4.0000 mg | Freq: Four times a day (QID) | INTRAMUSCULAR | Status: DC | PRN

## 2016-12-29 MED ORDER — COCONUT OIL OIL: 1.0000 "application " | TOPICAL_OIL | Status: DC | PRN

## 2016-12-29 MED ORDER — DIPHENHYDRAMINE HCL 25 MG PO CAPS: 25.0000 mg | ORAL_CAPSULE | Freq: Four times a day (QID) | ORAL | Status: DC | PRN

## 2016-12-29 MED ORDER — IBUPROFEN 600 MG PO TABS
600.0000 mg | ORAL_TABLET | Freq: Four times a day (QID) | ORAL | Status: DC
  Administered 2016-12-29 – 2016-12-31 (×7): 600 mg via ORAL
  Filled 2016-12-29 (×7): qty 1

## 2016-12-29 MED ORDER — DIBUCAINE 1 % RE OINT: 1.0000 "application " | TOPICAL_OINTMENT | RECTAL | Status: DC | PRN

## 2016-12-29 MED ORDER — ONDANSETRON HCL 4 MG/2ML IJ SOLN: 4.0000 mg | INTRAMUSCULAR | Status: DC | PRN

## 2016-12-29 MED ORDER — SOD CITRATE-CITRIC ACID 500-334 MG/5ML PO SOLN: 30.0000 mL | ORAL | Status: DC | PRN

## 2016-12-29 MED ORDER — SIMETHICONE 80 MG PO CHEW: 80.0000 mg | CHEWABLE_TABLET | ORAL | Status: DC | PRN

## 2016-12-29 MED ORDER — AMMONIA AROMATIC IN INHA
RESPIRATORY_TRACT | Status: AC
  Filled 2016-12-29: qty 10

## 2016-12-29 MED ORDER — FERROUS SULFATE 325 (65 FE) MG PO TABS
325.0000 mg | ORAL_TABLET | Freq: Two times a day (BID) | ORAL | Status: DC
  Administered 2016-12-29 – 2016-12-31 (×4): 325 mg via ORAL
  Filled 2016-12-29 (×4): qty 1

## 2016-12-29 MED ORDER — OXYTOCIN 40 UNITS IN LACTATED RINGERS INFUSION - SIMPLE MED
2.5000 [IU]/h | INTRAVENOUS | Status: DC
  Filled 2016-12-29: qty 1000

## 2016-12-29 MED ORDER — LACTATED RINGERS IV SOLN
INTRAVENOUS | Status: DC
  Administered 2016-12-29: 1000 mL via INTRAVENOUS

## 2016-12-29 MED ORDER — MISOPROSTOL 200 MCG PO TABS
ORAL_TABLET | ORAL | Status: AC
  Filled 2016-12-29: qty 4

## 2016-12-29 NOTE — Anesthesia Procedure Notes (Signed)
Epidural Patient location during procedure: OB  Staffing Anesthesiologist: Annis Lagoy Performed: anesthesiologist   Preanesthetic Checklist Completed: patient identified, site marked, surgical consent, pre-op evaluation, timeout performed, IV checked, risks and benefits discussed and monitors and equipment checked  Epidural Patient position: sitting Prep: Betadine Patient monitoring: heart rate, continuous pulse ox and blood pressure Approach: midline Location: L4-L5 Injection technique: LOR saline  Needle:  Needle type: Tuohy  Needle gauge: 18 G Needle length: 9 cm and 9 Catheter type: closed end flexible Catheter size: 20 Guage Test dose: negative and 1.5% lidocaine with Epi 1:200 K  Assessment Sensory level: T10 Events: blood not aspirated, injection not painful, no injection resistance, negative IV test and no paresthesia  Additional Notes   Patient tolerated the insertion well without complications.Reason for block:procedure for pain     

## 2016-12-29 NOTE — Lactation Note (Signed)
This note was copied from a baby's chart. Lactation Consultation Note  Patient Name: Shawna Judithann SheenDenea Travis NWGNF'AToday's Date: 12/29/2016 Reason for consult: Initial assessment This is first feeding after delivery. Mom nursed her 923 1/33 y.o. For 3 years. I reviewed basics of nursing a newborn baby vs older baby, but Mom did great with position and latch. Needed some pillow support. We discussed strategies for minimizing/avoiding problems she had with first baby like over supply, mastitis, and signs of tongue tie. They are off to a great start as Samuel BoucheLucas has great latch and many audible swallows as mom compresses breast occasionally. She was told to expect a Breastfeeding booklet with LC contact info and info on [facility].   Maternal Data Does the patient have breastfeeding experience prior to this delivery?: Yes  Feeding    LATCH Score Latch: Grasps breast easily, tongue down, lips flanged, rhythmical sucking.  Audible Swallowing: A few with stimulation  Type of Nipple: Everted at rest and after stimulation  Comfort (Breast/Nipple): Soft / non-tender  Hold (Positioning): No assistance needed to correctly position infant at breast.  LATCH Score: 9  Interventions Interventions: Breast feeding basics reviewed;Breast massage;Support pillows  Lactation Tools Discussed/Used     Consult Status Consult Status: PRN    Sunday CornSandra Clark Nakeysha Pasqual 12/29/2016, 2:33 PM

## 2016-12-29 NOTE — H&P (Addendum)
OB History & Physical   History of Present Illness:  Chief Complaint: contractions  HPI:  Shawna Travis is a 31 y.o. G60P1011 female at 33w1ddated by 6 week ultrasound.  Her pregnancy has been complicated by diet controlled gestational diabetes, obesity with BMI >40, Rh negative status. .    She reports contractions.   She denies leakage of fluid.   She denies vaginal bleeding.   She reports fetal movement.    Maternal Medical History:   Past Medical History:  Diagnosis Date  . Gestational diabetes   . Ovarian cyst    2011  . PCOS (polycystic ovarian syndrome)     Past Surgical History:  Procedure Laterality Date  . cyst on ovary    . OVARIAN CYST REMOVAL  2011  . WISDOM TOOTH EXTRACTION      Allergies  Allergen Reactions  . Sulfa Antibiotics Hives    Prior to Admission medications   Medication Sig Start Date End Date Taking? Authorizing Provider  ACCU-CHEK FASTCLIX LANCETS MISC 1 Units by Percutaneous route 4 (four) times daily. 10/22/16  Yes GDalia Heading CNM  Blood Glucose Monitoring Suppl (ONE TOUCH ULTRA MINI) w/Device KIT USE AS DIRECTED TO TEST 4 TIMES DAILY 10/25/16  Yes [provider]  Prenatal Vit-Fe Fumarate-FA (PRENATAL MULTIVITAMIN) TABS tablet Take 1 tablet by mouth at bedtime.   Yes [provider]  cyclobenzaprine (FLEXERIL) 10 MG tablet Take 1 tablet (10 mg total) by mouth every 8 (eight) hours as needed for muscle spasms. 07/11/16   BJulianne Handler CNM  KETOCARE strip See admin instructions. 10/22/16   [provider]  ONE TOUCH ULTRA TEST test strip CHECK BLOOD SUGAR 4 TIMES A DAY 12/29/16   GDalia Heading CNM    OB History  Gravida Para Term Preterm AB Living  3 1 1   1 1   SAB TAB Ectopic Multiple Live Births  1       1    # Outcome Date GA Lbr Len/2nd Weight Sex Delivery Anes PTL Lv  3 Current           2 Term 03/28/13 446w2d8 lb 13 oz (3.997 kg) M Vag-Spont  N LIV  1 SAB 2004 5w58w5d          Prenatal  care site: [facility]  Social History: She  reports that she quit smoking about 4 years ago. Her smoking use included Cigarettes. She smoked 15.00 packs per day. She has never used smokeless tobacco. She reports that she does not drink alcohol or use drugs.  Family History: family history includes Heart disease in her maternal grandmother and sister; Hyperlipidemia in her father; Hypertension in her father.   Review of Systems: Negative x 10 systems reviewed except as noted in the HPI.    Physical Exam:  Vital Signs: LMP 03/10/2016  Constitutional: Well nourished, well developed female in no acute distress.  HEENT: normal Skin: Warm and dry.  Cardiovascular: Regular rate and rhythm.   Extremity: no edema  Respiratory: Clear to auscultation bilateral. Normal respiratory effort Abdomen: FHT present and gravid, tender w ctx Back: no CVAT Neuro: DTRs 2+, Cranial nerves grossly intact Psych: Alert and Oriented x3. No memory deficits. Normal mood and affect.  MS: normal gait, normal bilateral lower extremity ROM/strength/stability.  Pelvic exam: Cervix: 8 cm / bulging bag per RN  Pertinent Results:  Prenatal Labs: Blood type/Rh O negative  Antibody screen negative  Rubella Immune  Varicella Immune  RPR NR  HBsAg negative  HIV negative  GC negative  Chlamydia negative  Genetic screening 1st trim screen neg, msAFP neg  1 hour GTT GDM  3 hour GTT GDMA1  GBS negative on 12/09/16   Baseline FHR: 130 beats/min   Variability: moderate   Accelerations: present   Decelerations: absent Contractions: present frequency: 3-4 q 10 min Overall assessment: cat 1  Assessment:  Shawna Travis is a 31 y.o. G49P1011 female at 18w1dwith active labor.   Plan:  1. Admit to Labor & Delivery  2. CBC, T&S, Clrs, IVF 3. GBS negative within 5 weeks.   4. Fetwal well-being: reassuring 5. Expect vaginal delivery soon, Total Weight Gain 15 pounds.     SPrentice Docker MD 12/29/2016 11:30 AM

## 2016-12-29 NOTE — Discharge Summary (Signed)
OB Discharge Summary     Patient Name: Shawna Travis DOB: 1985/09/08 MRN: [id number]  Date of admission: 12/29/2016 Delivering MD: Prentice Docker, MD  Date of Delivery: 12/29/2016  Date of discharge: 12/31/2016  Admitting diagnosis: contractions 39 wks preg Intrauterine pregnancy: [redacted]w[redacted]d    Secondary diagnosis: Gestational Diabetes diet controlled (A1)     Discharge diagnosis: Term Pregnancy Delivered and GDM A1                                                                                                Post partum procedures:rhogam Baby A+  Augmentation: none  Complications: None  Hospital course:  Onset of Labor With Vaginal Delivery     31y.o. yo G3P2011 at 344w1das admitted in Active Labor on 12/29/2016. Patient had an uncomplicated labor course as follows:  Membrane Rupture Time/Date: 12:51 PM ,12/29/2016   Intrapartum Procedures: Episiotomy: None [1]                                         Lacerations:  1st degree [2];Vaginal [6]  Patient had a delivery of a Viable infant. 12/29/2016  Information for the patient's newborn:  HoBrownie, Gockel0[[id number]]Delivery Method: Vaginal, Spontaneous Delivery (Filed from Delivery Summary)    Pateint had an uncomplicated postpartum course.  She is ambulating, tolerating a regular diet, passing flatus, and urinating well. Patient is discharged home in stable condition on 12/31/2016   Physical exam  Vitals:   12/30/16 1141 12/30/16 1552 12/30/16 1927 12/31/16 0827  BP: (!) 118/59  135/66 (!) 119/54  Pulse: 78  86 75  Resp: _0 Temp: 98.1 F (36.7 C) 98.5 F (36.9 C) 98.4 F (36.9 C) 97.8 F (36.6 C)  TempSrc: Oral Oral Oral Oral  SpO2: 97%  98% 100%  Weight:      Height:       General: alert, cooperative and no distress Lochia: appropriate Uterine Fundus: firm/ U-1/ ML/ NT Perineum: NT DVT Evaluation: No evidence of DVT seen on physical exam. No significant calf/ankle edema.  Labs: Lab Results  Component  Value Date   WBC 11.5 (H) 12/30/2016   HGB 12.1 12/30/2016   HCT 34.4 (L) 12/30/2016   MCV 93.3 12/30/2016   PLT 179 12/30/2016  O NEG/ RI/ VI  Discharge instruction: per After Visit Summary.  Medications:  Allergies as of 12/31/2016      Reactions   Sulfa Antibiotics Hives      Medication List    STOP taking these medications   ACCU-CHEK FASTCLIX LANCETS Misc   cyclobenzaprine 10 MG tablet Commonly known as:  FLEXERIL   KETOCARE strip Generic drug:  acetone (urine) test   ONE TOUCH ULTRA MINI w/Device Kit   ONE TOUCH ULTRA TEST test strip Generic drug:  glucose blood     TAKE these medications   ibuprofen 600 MG tablet Commonly known as:  ADVIL,MOTRIN Take 1 tablet (600 mg total) by mouth every 6 (six) hours  as needed.   prenatal multivitamin Tabs tablet Take 1 tablet by mouth at bedtime.            Discharge Care Instructions        Start     Ordered   12/31/16 0000  ibuprofen (ADVIL,MOTRIN) 600 MG tablet  Every 6 hours PRN     12/31/16 0859   12/31/16 0000  Discharge patient    Question Answer Comment  Discharge disposition 01-Home or Self Care   Discharge patient date 12/31/2016      12/31/16 8307      Diet: routine diet  Activity: Advance as tolerated. Pelvic rest for 6 weeks.   Outpatient follow up: Follow-up Information    Will Bonnet, MD Follow up in 6 week(s).   Specialty:  Obstetrics and Gynecology Why:  schedule 2 hour glucose tolerance test and 6 week postpartum visit Contact information: [address] [place] [address] [phone]             Postpartum contraception: Vasectomy and Condoms Rhogam Given postpartum: yes Rubella vaccine given postpartum: no Varicella vaccine given postpartum: no TDaP given antepartum or postpartum: AP  Newborn Data: Live born female / Linton Rump Birth Weight:  7#4.8 oz APGAR: 9, 9   Baby Feeding: Breast  Disposition:home with mother  SIGNED: Dalia Heading,  CNM

## 2016-12-29 NOTE — Anesthesia Preprocedure Evaluation (Signed)
Anesthesia Evaluation  Patient identified by MRN, date of birth, ID band Patient awake    Reviewed: Allergy & Precautions, NPO status , Patient's Chart, lab work & pertinent test results, reviewed documented beta blocker date and time   Airway Mallampati: II  TM Distance: >3 FB     Dental  (+) Chipped   Pulmonary former smoker,           Cardiovascular      Neuro/Psych    GI/Hepatic   Endo/Other  diabetes, Type 2  Renal/GU      Musculoskeletal   Abdominal   Peds  Hematology   Anesthesia Other Findings   Reproductive/Obstetrics                             Anesthesia Physical Anesthesia Plan  ASA: III  Anesthesia Plan: Epidural   Post-op Pain Management:    Induction:   PONV Risk Score and Plan:   Airway Management Planned:   Additional Equipment:   Intra-op Plan:   Post-operative Plan:   Informed Consent: I have reviewed the patients History and Physical, chart, labs and discussed the procedure including the risks, benefits and alternatives for the proposed anesthesia with the patient or authorized representative who has indicated his/her understanding and acceptance.     Plan Discussed with: CRNA  Anesthesia Plan Comments:         Anesthesia Quick Evaluation

## 2016-12-30 LAB — CBC
HCT: 34.4 % — ABNORMAL LOW (ref 35.0–47.0)
HEMOGLOBIN: 12.1 g/dL (ref 12.0–16.0)
MCH: 32.9 pg (ref 26.0–34.0)
MCHC: 35.3 g/dL (ref 32.0–36.0)
MCV: 93.3 fL (ref 80.0–100.0)
Platelets: 179 10*3/uL (ref 150–440)
RBC: 3.68 MIL/uL — ABNORMAL LOW (ref 3.80–5.20)
RDW: 13.9 % (ref 11.5–14.5)
WBC: 11.5 10*3/uL — ABNORMAL HIGH (ref 3.6–11.0)

## 2016-12-30 LAB — RPR: RPR Ser Ql: NONREACTIVE

## 2016-12-30 LAB — FETAL SCREEN: FETAL SCREEN: NEGATIVE

## 2016-12-30 LAB — GLUCOSE, CAPILLARY: Glucose-Capillary: 81 mg/dL (ref 65–99)

## 2016-12-30 MED ORDER — SODIUM CHLORIDE 0.9% FLUSH
3.0000 mL | Freq: Three times a day (TID) | INTRAVENOUS | Status: DC
  Administered 2016-12-30: 3 mL via INTRAVENOUS

## 2016-12-30 MED ORDER — RHO D IMMUNE GLOBULIN 1500 UNIT/2ML IJ SOSY
300.0000 ug | PREFILLED_SYRINGE | Freq: Once | INTRAMUSCULAR | Status: AC
  Administered 2016-12-30: 300 ug via INTRAMUSCULAR
  Filled 2016-12-30: qty 2

## 2016-12-30 NOTE — Anesthesia Postprocedure Evaluation (Signed)
Anesthesia Post Note  Patient: Shawna Travis  Procedure(s) Performed: * No procedures listed *  Patient location during evaluation: Mother Baby Anesthesia Type: Epidural Level of consciousness: awake and alert and oriented Pain management: satisfactory to patient Vital Signs Assessment: post-procedure vital signs reviewed and stable Respiratory status: respiratory function stable Cardiovascular status: stable Postop Assessment: no headache, no signs of nausea or vomiting, epidural receding, patient able to bend at knees, no backache and adequate PO intake Anesthetic complications: no     Last Vitals:  Vitals:   12/30/16 0035 12/30/16 0431  BP: (!) 101/43 126/61  Pulse: 91 79  Resp: 18 20  Temp: 36.7 C 36.5 C  SpO2:      Last Pain:  Vitals:   12/30/16 0431  TempSrc: Oral  PainSc:                  Clydene PughBeane, Alexiah Koroma D

## 2016-12-30 NOTE — Progress Notes (Signed)
Obstetric Postpartum Daily Progress Note Subjective:  31 y.o. G3P2011 postpartum day #1 status post vaginal delivery.  She is ambulating, is tolerating po, is voiding spontaneously.  Her pain is well controlled on PO pain medications. Her lochia is less than menses.   Medications SCHEDULED MEDICATIONS  . ferrous sulfate  325 mg Oral BID WC  . ibuprofen  600 mg Oral Q6H  . prenatal multivitamin  1 tablet Oral Q1200  . senna-docusate  2 tablet Oral Q24H  . sodium chloride flush  3 mL Intravenous Q8H    MEDICATION INFUSIONS    PRN MEDICATIONS  acetaminophen, benzocaine-Menthol, coconut oil, witch hazel-glycerin **AND** dibucaine, diphenhydrAMINE, HYDROcodone-acetaminophen, ondansetron **OR** ondansetron (ZOFRAN) IV, simethicone    Objective:   Vitals:   12/29/16 1926 12/30/16 0035 12/30/16 0431 12/30/16 0815  BP: (!) 116/56 (!) 101/43 126/61 115/64  Pulse: 86 91 79 82  Resp: 18 18 20 18   Temp: 98 F (36.7 C) 98.1 F (36.7 C) 97.7 F (36.5 C) 98.5 F (36.9 C)  TempSrc: Oral Oral Oral Oral  SpO2: 99%   98%  Weight:      Height:        Current Vital Signs 24h Vital Sign Ranges  T 98.5 F (36.9 C) Temp  Avg: 98.2 F (36.8 C)  Min: 97.7 F (36.5 C)  Max: 98.5 F (36.9 C)  BP 115/64 BP  Min: 94/47  Max: 134/79  HR 82 Pulse  Avg: 85  Min: 77  Max: 95  RR 18 Resp  Avg: 18.3  Min: 18  Max: 20  SaO2 98 %   SpO2  Avg: 98.2 %  Min: 95 %  Max: 100 %       24 Hour I/O Current Shift I/O  Time Ins Outs 08/29 0701 - 08/30 0700 In: 240 [P.O.:240] Out: 1618 [Urine:900] 08/30 0701 - 08/30 1900 In: 240 [P.O.:240] Out: -    General: NAD Pulmonary: no increased work of breathing Abdomen: non-distended, non-tender, fundus firm at U/1 Extremities: no edema, no erythema, no tenderness  Labs:   Recent Labs Lab 12/29/16 1126 12/30/16 0514  WBC 11.3* 11.5*  HGB 14.1 12.1  HCT 40.1 34.4*  PLT 208 179   Glucose 81 this morning.  Assessment:   31 y.o. G3P2011 postpartum day # 1  status post SVD.  Plan:   1) Acute blood loss - hemodynamically stable and asymptomatic.  2) O NEG / Rubella Immune (01/11 0000)/ Varicella Immune Information for the patient's newborn:  Vergie LivingHolley, Boy Arine [[id number]][[id number]]  A POS  RhoGam given   3) TDAP status: received antepartum  4) Breast /Contraception = unknown  5) Disposition: Continue normal postpartum care., discharge anticipated tomorrow.  Marcelyn BruinsJacelyn Schmid, CNM 12/30/2016  10:43 AM

## 2016-12-30 NOTE — Lactation Note (Signed)
This note was copied from a baby's chart. Lactation Consultation Note  Patient Name: Boy Judithann SheenDenea Helmers ZOXWR'UToday's Date: 12/30/2016 Reason for consult: Initial assessment   Maternal Data Formula Feeding for Exclusion: No Does the patient have breastfeeding experience prior to this delivery?: Yes  Feeding Feeding Type: Breast Fed Length of feed: 0 min  LATCH Score Latch: Too sleepy or reluctant, no latch achieved, no sucking elicited.  Audible Swallowing: None  Type of Nipple: Everted at rest and after stimulation  Comfort (Breast/Nipple): Soft / non-tender  Hold (Positioning): No assistance needed to correctly position infant at breast.  LATCH Score: 6  Interventions Interventions: Breast feeding basics reviewed  Lactation Tools Discussed/Used     Consult Status Consult Status: Follow-up Follow-up type: In-patient    Trudee GripCarolyn P Andrena Margerum 12/30/2016, 12:27 PM

## 2016-12-30 NOTE — Plan of Care (Signed)
Problem: Pain Managment: Goal: General experience of comfort will improve Outcome: Progressing Pt denies pain  Problem: Tissue Perfusion: Goal: Risk factors for ineffective tissue perfusion will decrease Outcome: Progressing Pt is ambulating   Problem: Pain Management: Goal: General experience of comfort will improve and pain level will decrease Outcome: Progressing Pt denies pain

## 2016-12-31 LAB — RHOGAM INJECTION: Unit division: 0

## 2016-12-31 MED ORDER — IBUPROFEN 600 MG PO TABS: 600.0000 mg | ORAL_TABLET | Freq: Four times a day (QID) | ORAL | 1 refills | Status: AC | PRN

## 2016-12-31 NOTE — Progress Notes (Signed)
Patient discharged home with infant. Discharge instructions, prescriptions and follow up appointment given to and reviewed with patient. Patient verbalized understanding. Patient wheeled out via auxiliary with infant.

## 2017-01-04 ENCOUNTER — Encounter: Payer: 59 | Admitting: Advanced Practice Midwife

## 2017-01-04 ENCOUNTER — Other Ambulatory Visit: Payer: 59

## 2017-02-07 ENCOUNTER — Ambulatory Visit: Payer: 59 | Admitting: Obstetrics and Gynecology

## 2017-02-07 ENCOUNTER — Other Ambulatory Visit: Payer: 59

## 2017-02-18 ENCOUNTER — Encounter: Payer: Self-pay | Admitting: Obstetrics and Gynecology

## 2017-02-18 ENCOUNTER — Ambulatory Visit (INDEPENDENT_AMBULATORY_CARE_PROVIDER_SITE_OTHER): Payer: 59 | Admitting: Obstetrics and Gynecology

## 2017-02-18 LAB — POCT HEMOGLOBIN: HEMOGLOBIN: 13.5 g/dL (ref 12.2–16.2)

## 2017-02-18 NOTE — Progress Notes (Signed)
Postpartum Visit   Chief Complaint  Patient presents with  . Postpartum Care    6 wk ppc   History of Present Illness: Patient is a 31 y.o. [id number] presents for postpartum visit.  Date of delivery: 12/29/16 Type of delivery: Vaginal delivery - Vacuum or forceps assisted  no Episiotomy No.  Laceration: yes, 1st degree Pregnancy or labor problems:  Gestational diabetes (diet controlled) Any problems since the delivery:  no  Newborn Details:  SINGLETON :  1. Baby's name: Samuel Bouche. Birth weight: 7lb 5 oz Maternal Details:  Breast Feeding:  yes Post partum depression/anxiety noted:  no Edinburgh Post-Partum Depression Score:  0  Date of last PAP: 05/2016  normal   Past Medical History:  Diagnosis Date  . Gestational diabetes   . Ovarian cyst    2011  . PCOS (polycystic ovarian syndrome)     Past Surgical History:  Procedure Laterality Date  . cyst on ovary    . OVARIAN CYST REMOVAL  2011  . WISDOM TOOTH EXTRACTION      Prior to Admission medications   Medication Sig Start Date End Date Taking? Authorizing Provider  ibuprofen (ADVIL,MOTRIN) 600 MG tablet Take 1 tablet (600 mg total) by mouth every 6 (six) hours as needed. 12/31/16   Farrel Conners, CNM  Prenatal Vit-Fe Fumarate-FA (PRENATAL MULTIVITAMIN) TABS tablet Take 1 tablet by mouth at bedtime.    [provider]    Allergies  Allergen Reactions  . Sulfa Antibiotics Hives     Social History   Social History  . Marital status: Married    Spouse name: N/A  . Number of children: N/A  . Years of education: N/A   Occupational History  . Not on file.   Social History Main Topics  . Smoking status: Former Smoker    Packs/day: 15.00    Types: Cigarettes    Quit date: 07/23/2012  . Smokeless tobacco: Never Used  . Alcohol use No  . Drug use: No     Comment: prior to pregnancy  . Sexual activity: Yes   Other Topics Concern  . Not on file   Social History Narrative  . No narrative on file     Family History  Problem Relation Age of Onset  . Hypertension Father   . Hyperlipidemia Father   . Heart disease Sister   . Heart disease Maternal Grandmother     Review of Systems  Constitutional: Negative.   HENT: Negative.   Eyes: Negative.   Respiratory: Negative.   Cardiovascular: Negative.   Gastrointestinal: Negative.   Genitourinary: Negative.   Musculoskeletal: Negative.   Skin: Negative.   Neurological: Negative.   Psychiatric/Behavioral: Negative.      Physical Exam BP 110/68 (BP Location: Left Arm, Patient Position: Sitting, Cuff Size: Normal)   Pulse 86   Ht 5' 3.25" (1.607 m)   Wt 203 lb (92.1 kg)   LMP 12/29/2016   Breastfeeding? Yes   BMI 35.68 kg/m   Physical Exam  Constitutional: She is oriented to person, place, and time. She appears well-developed and well-nourished. No distress.  Genitourinary: Pelvic exam was performed with patient supine. There is no rash, tenderness or lesion on the right labia. There is no rash, tenderness or lesion on the left labia.  Eyes: EOM are normal. No scleral icterus.  Neck: Normal range of motion. Neck supple.  Cardiovascular: Normal rate and regular rhythm.   Pulmonary/Chest: Effort normal and breath sounds normal. No respiratory distress. She has no wheezes.  She has no rales.  Abdominal: Soft. Bowel sounds are normal. She exhibits no distension and no mass. There is no tenderness. There is no rebound and no guarding.  Musculoskeletal: Normal range of motion. She exhibits no edema.  Neurological: She is alert and oriented to person, place, and time. No cranial nerve deficit.  Skin: Skin is warm and dry. No erythema.  Psychiatric: She has a normal mood and affect. Her behavior is normal. Judgment normal.    Female Chaperone present during breast and/or pelvic exam.  Assessment: 31 y.o. G3P2011 presenting for 6 week postpartum visit  Plan: Problem List Items Addressed This Visit    Postpartum care following  vaginal delivery - Primary   Relevant Orders   POCT hemoglobin     1) Contraception Education given regarding options for contraception, including condoms and vasectomy.  2)  Pap - ASCCP guidelines and rational discussed.  Patient opts for routine screening interval  3) Patient underwent screening for postpartum depression with no concerns noted.  4) Follow up 1 year for routine annual exam  Thomasene MohairStephen Bexton Haak, MD 02/18/2017 11:00 AM

## 2017-10-11 IMAGING — US US OB TRANSVAGINAL
1 series · 15 of 28 positions shown · non-contrast
Comparison: None for this gestation

CLINICAL DATA: Vaginal bleeding in first trimester pregnancy,
reportedly had normal IUP at physician's office ultrasound on
05/13/2016 per patient, has had pelvic pain and bleeding for 2 days,
bright red blood and clots on [REDACTED], more darker blood today

EXAM:
OBSTETRIC <14 WK US AND TRANSVAGINAL OB US
TECHNIQUE: Both transabdominal and transvaginal ultrasound examinations were
performed for complete evaluation of the gestation as well as the
maternal uterus, adnexal regions, and pelvic cul-de-sac.
Transvaginal technique was performed to assess early pregnancy.

[Series 1: us ob transvaginal · 15 of 67 slices shown]
[im 1/67]
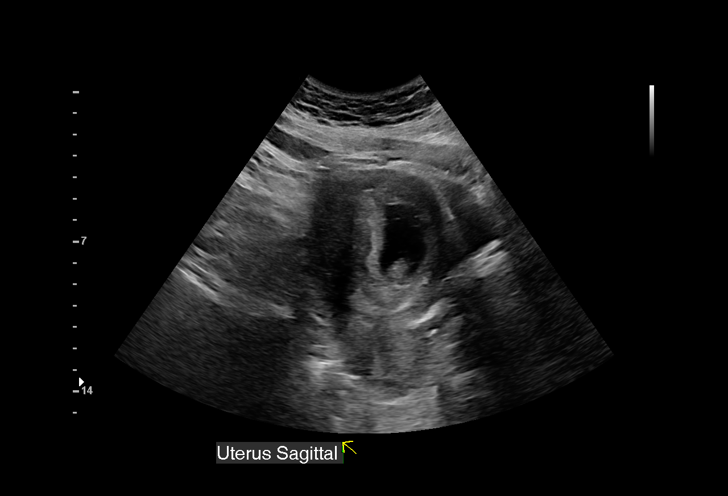
[im 5/67]
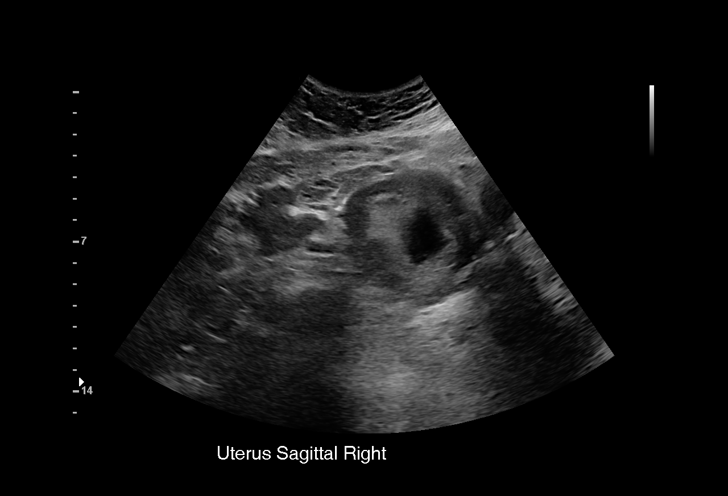
[im 10/67]
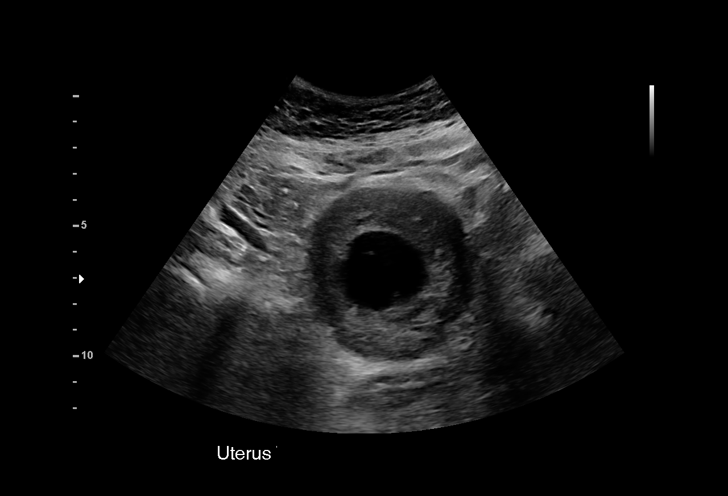
[im 15/67]
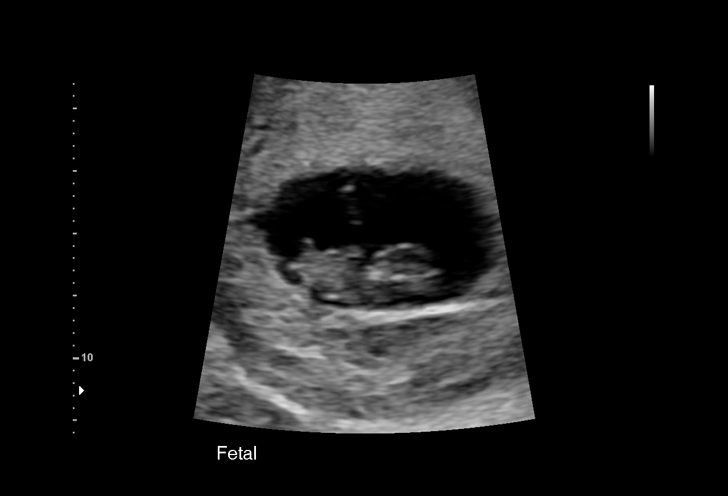
[im 20/67]
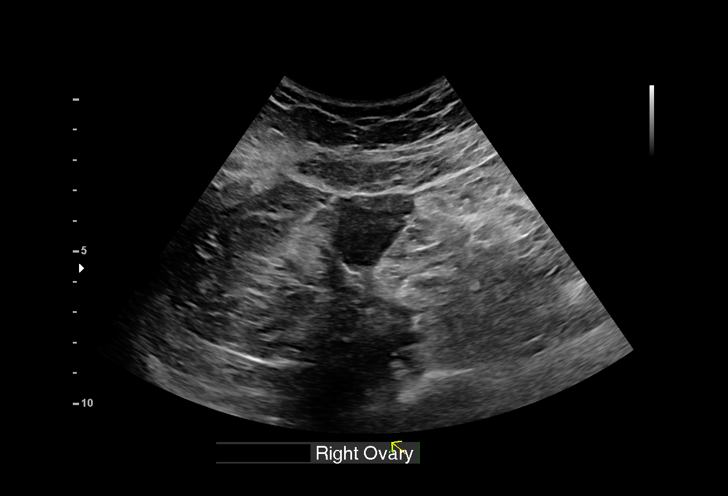
[im 25/67]
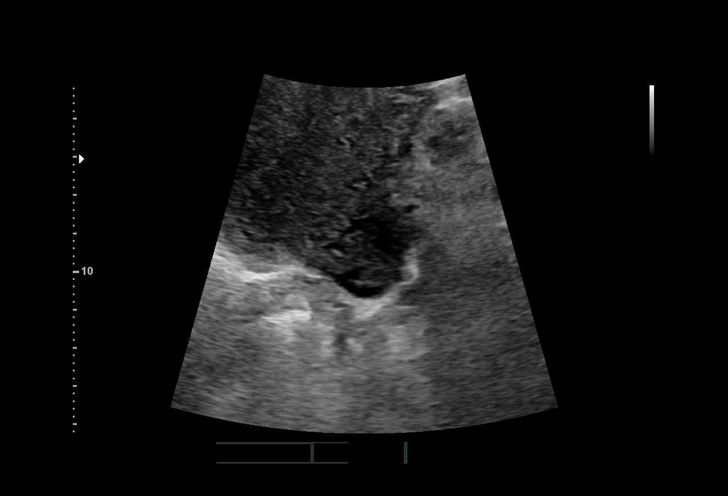
[im 30/67]
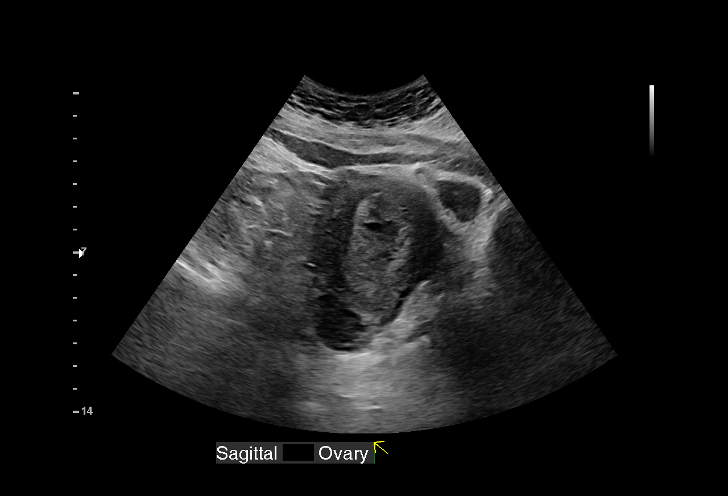
[im 35/67]
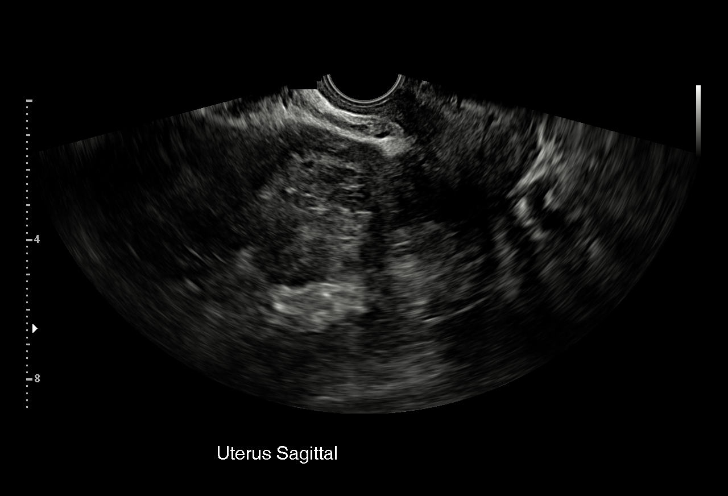
[im 37/67]
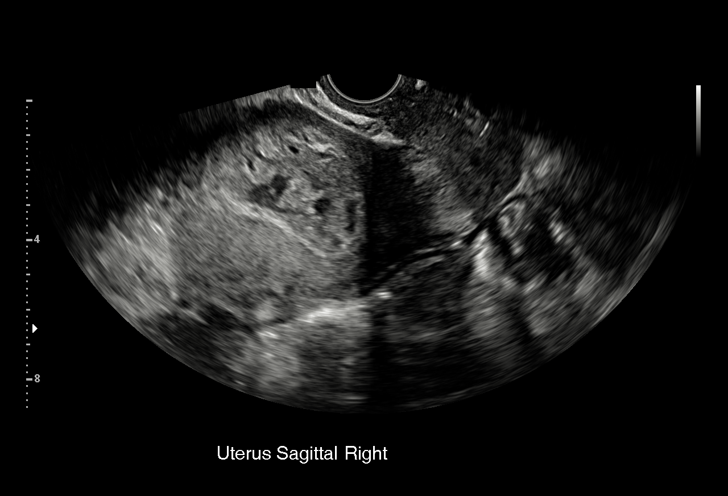
[im 42/67]
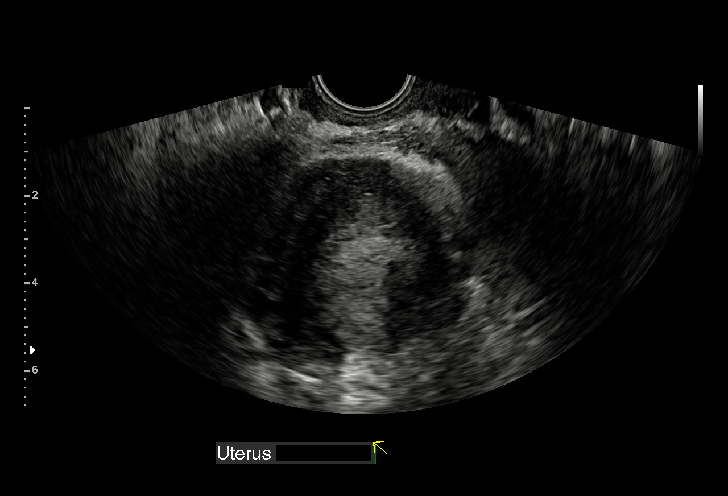
[im 47/67]
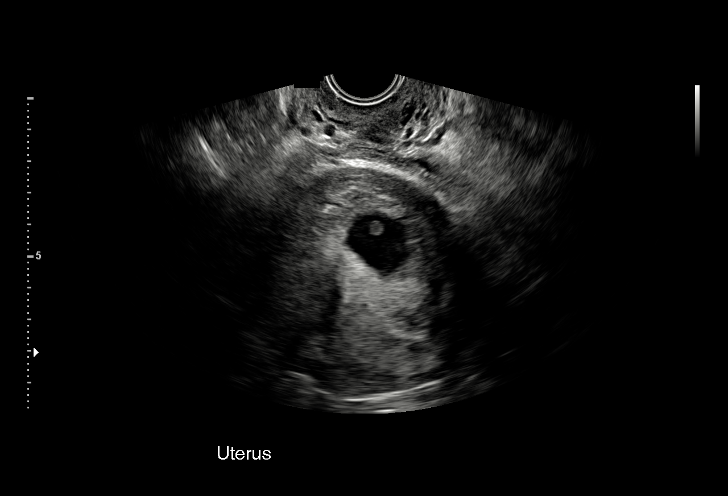
[im 52/67]
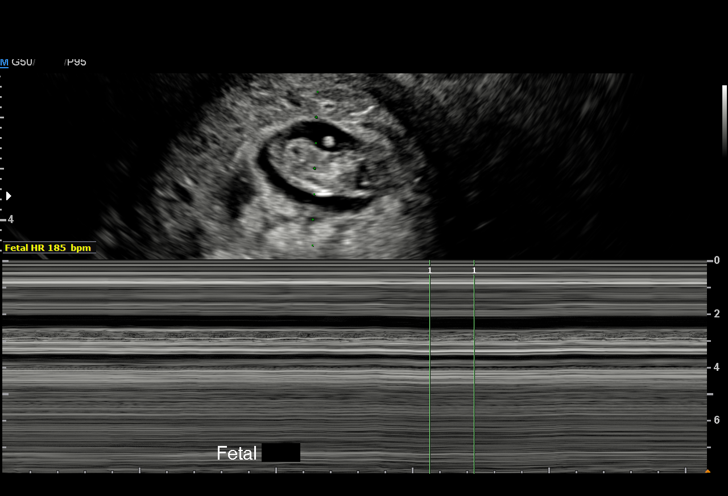
[im 57/67]
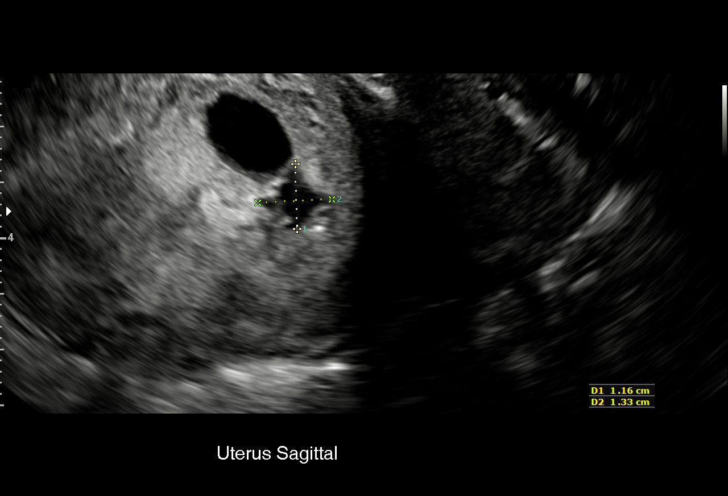
[im 62/67]
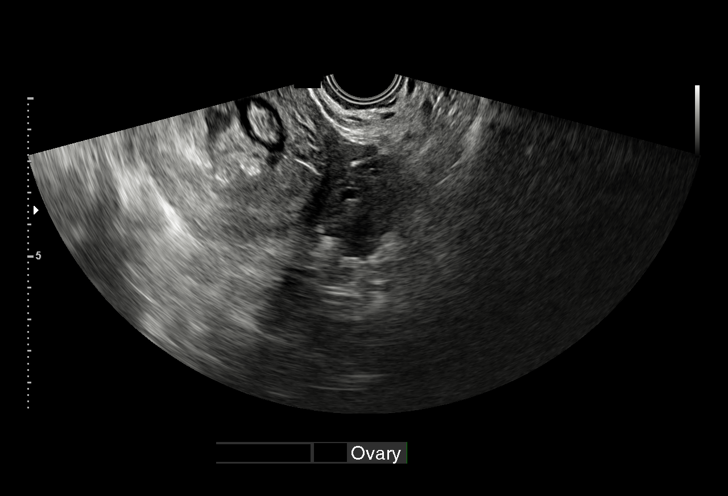
[im 67/67]
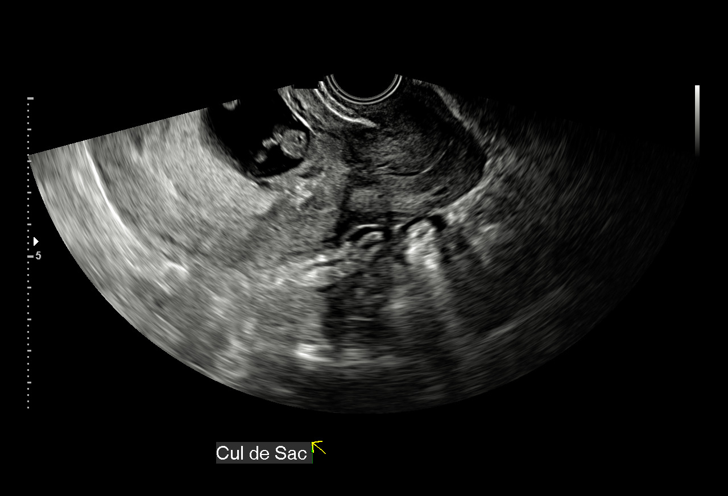

[15 of 28 positions shown; findings below may reference images not displayed]

FINDINGS: Intrauterine gestational sac: Present

Yolk sac:  Present

Embryo:  Present

Cardiac Activity: Present

Heart Rate: 186  bpm

CRL:  24.9  mm   9 w   1 d                  US EDC: 01/02/2017

Subchorionic hemorrhage: Small subchorionic hemorrhage 12 x 13 x 7
mm.

Maternal uterus/adnexae:

LEFT ovary normal size and morphology, 2.5 x 3.1 x 2.3 cm.

RIGHT ovary normal size and morphology, 2.8 x 3.9 x 2.7 cm.

No free pelvic fluid or adnexal masses.
IMPRESSION: Single live intrauterine gestation measured at 9 weeks 1 day EGA by
crown-rump length.

Small subchronic hemorrhage.

## 2017-11-21 IMAGING — US US RENAL
1 series · 15 of 25 positions shown · non-contrast
Comparison: 10/31/2013 abdominal sonogram.

CLINICAL DATA: Several days of bilateral back/ flank pain with
fever and chills. Patient is in the second trimester of pregnancy.

EXAM:
RENAL / URINARY TRACT ULTRASOUND COMPLETE

[Series 1: us renal · 15 of 43 slices shown]
[im 1/43]
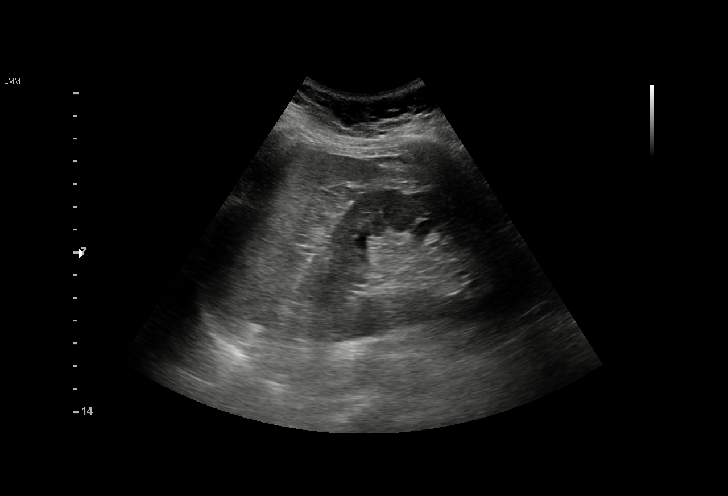
[im 4/43]
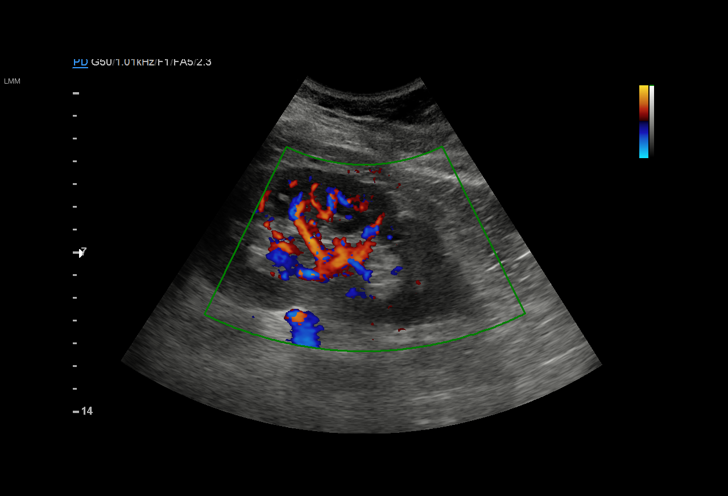
[im 8/43]
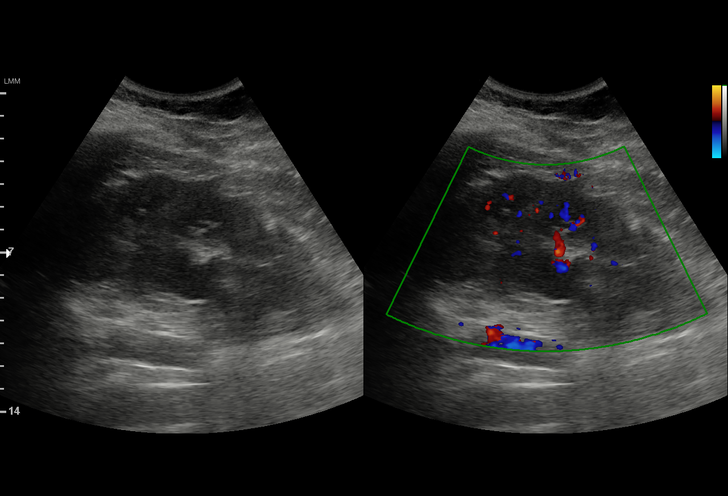
[im 9/43]
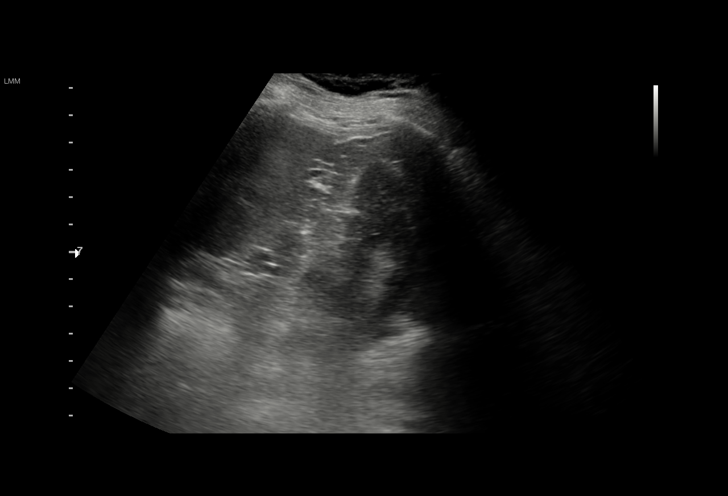
[im 13/43]
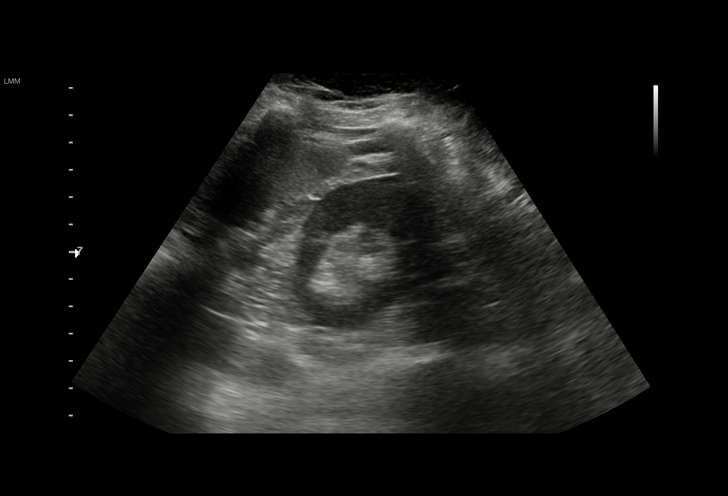
[im 16/43]
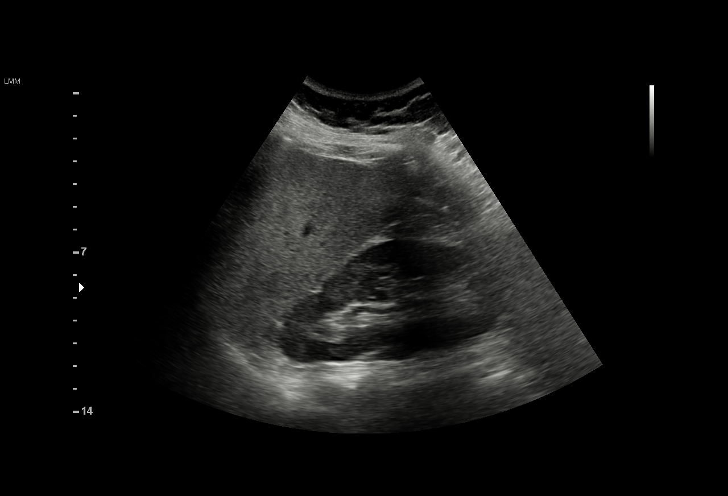
[im 18/43]
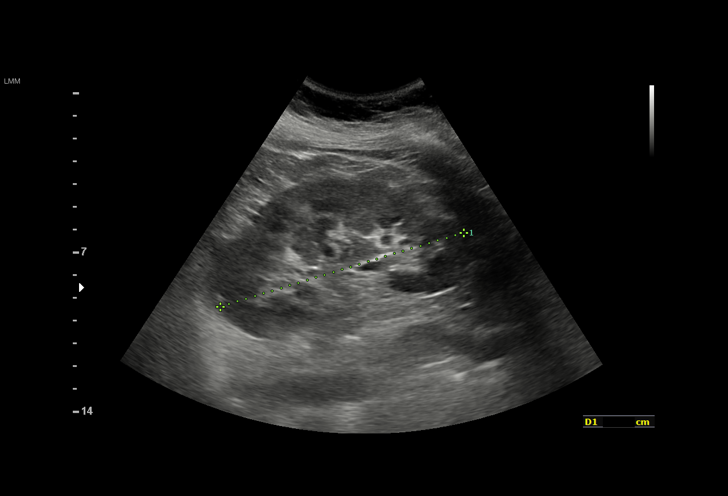
[im 22/43]
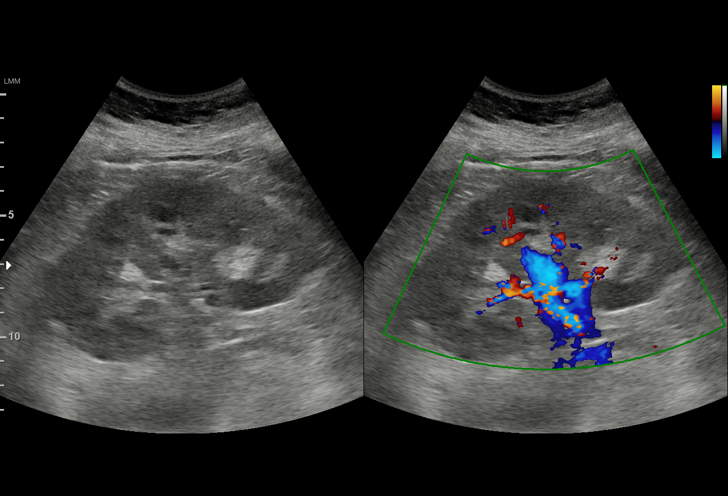
[im 25/43]
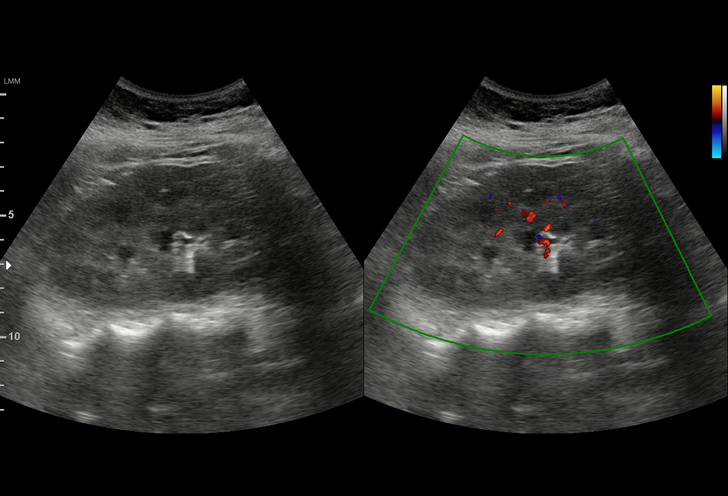
[im 27/43]
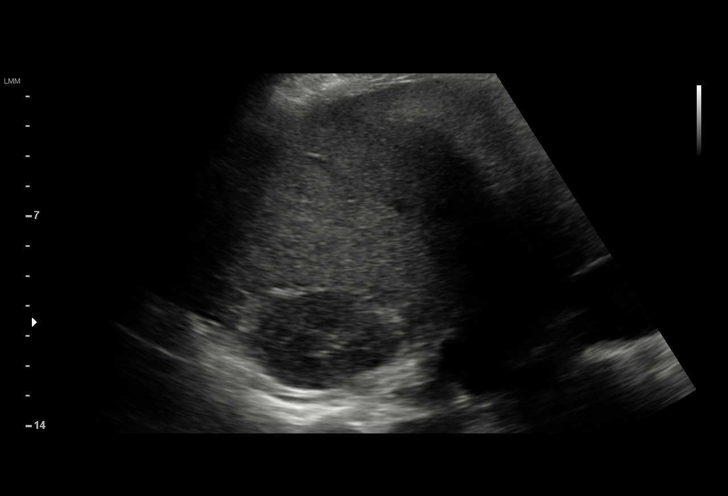
[im 30/43]
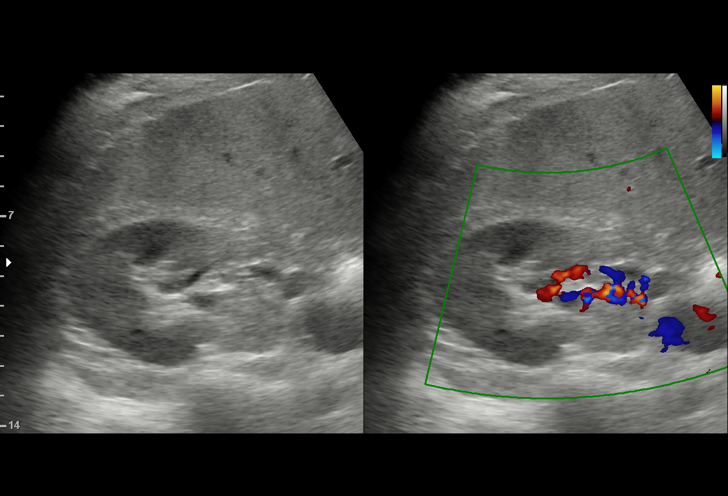
[im 34/43]
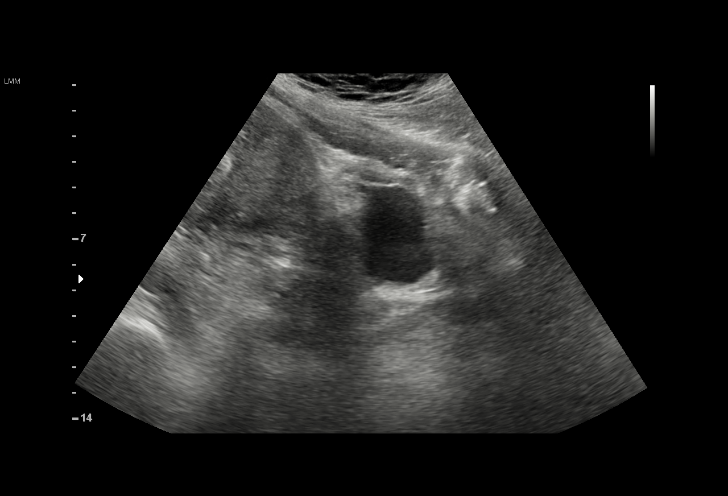
[im 36/43]
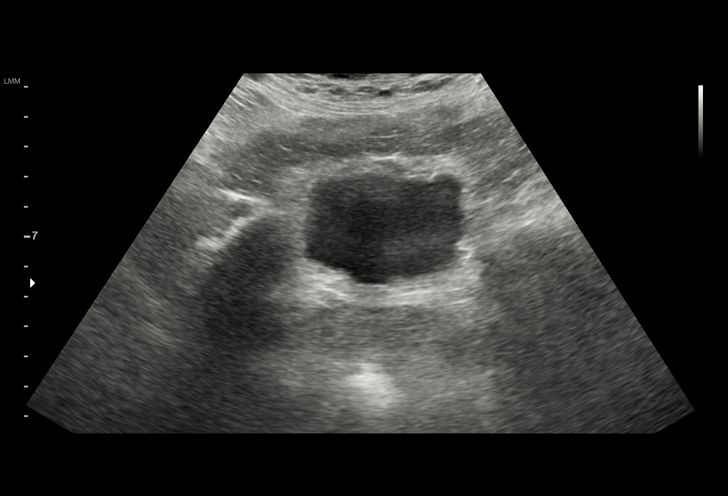
[im 39/43]
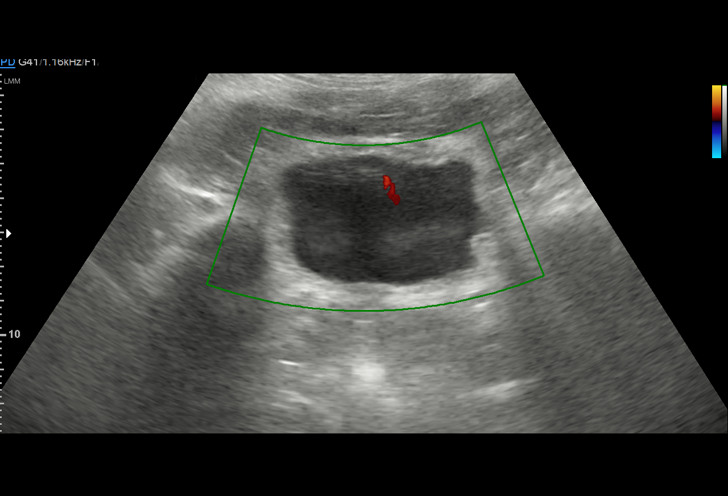
[im 43/43]
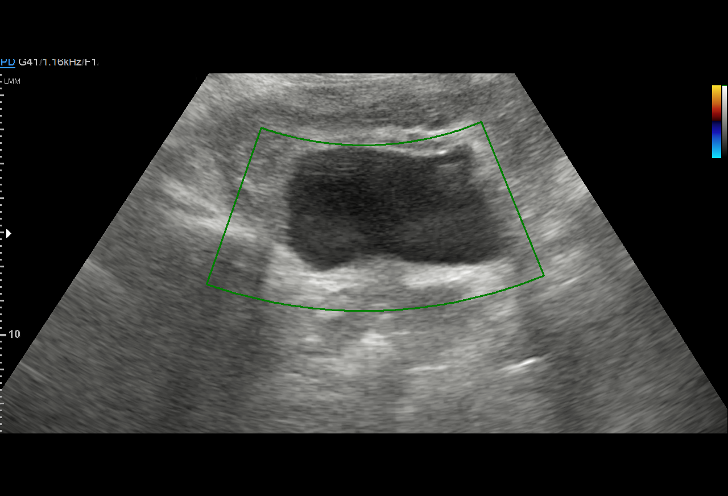

[15 of 25 positions shown; findings below may reference images not displayed]

FINDINGS: Right Kidney:

Length: 11.2 cm. Echogenicity within normal limits. No mass or
hydronephrosis visualized.

Left Kidney:

Length: 11.9 cm. Echogenicity within normal limits. No mass or
hydronephrosis visualized.

Bladder:

Appears normal for degree of bladder distention.
IMPRESSION: Normal ultrasound of the kidneys and bladder.

## 2021-03-16 ENCOUNTER — Ambulatory Visit
Admit: 2021-03-16 | Discharge: 2021-03-16 | Payer: PRIVATE HEALTH INSURANCE | Attending: Physician Assistant | Primary: Diagnostic Radiology

## 2021-03-16 DIAGNOSIS — R6889 Other general symptoms and signs: Secondary | ICD-10-CM

## 2021-03-16 LAB — AMB POC INFLUENZA ASSAY W/OPTIC
Flu A Antigen: NEGATIVE
Flu B Antigen: NEGATIVE

## 2021-03-16 MED ORDER — OSELTAMIVIR PHOSPHATE 75 MG PO CAPS
75 MG | ORAL_CAPSULE | Freq: Two times a day (BID) | ORAL | 0 refills | Status: AC
Start: 2021-03-16 — End: 2021-03-21

## 2021-03-16 NOTE — Progress Notes (Signed)
Ashley Baker is a 35 y.o. female     Chief Complaint   Patient presents with    Fever     Fever 101.3/ exposed to flu by family/ body aches/ sore throat x today         HPI  34 year old female presents with sore throat, congestion, runny nose, cough, fever, headache and body aches.  Temp up to 101.3.  Symptoms started within the last 12 hours.  States her whole family had has tested positive for the flu.  No current shortness of breath but has been using her inhalers as needed.  No chest pain or tightness.  States she had COVID a few months ago which hit her lungs hard and since then has been needing to use inhalers. Prior to having covid no other lung issues. No hx/o asthma. No vomiting or diarrhea. No other complaints.       Current Outpatient Medications   Medication Sig Dispense Refill    vitamin E 400 UNIT capsule Take 400 Units by mouth daily      oseltamivir (TAMIFLU) 75 MG capsule Take 1 capsule by mouth 2 times daily for 5 days 10 capsule 0    albuterol sulfate HFA (PROVENTIL;VENTOLIN;PROAIR) 108 (90 Base) MCG/ACT inhaler INHALE 1 PUFF AS NEEDED INTO THE LUNGS EVERY 4 HOURS FOR 30 DAYS      NORTREL 7/7/7 0.5/0.75/1-35 MG-MCG per tablet TAKE 1 TABLET BY MOUTH EVERY DAY FOR 84 DAYS       No current facility-administered medications for this visit.        History reviewed. No pertinent past medical history.     No Known Allergies     History reviewed. No pertinent surgical history.     History reviewed. No pertinent family history.     Social History     Socioeconomic History    Marital status: Married     Spouse name: Not on file    Number of children: Not on file    Years of education: Not on file    Highest education level: Not on file   Occupational History    Not on file   Tobacco Use    Smoking status: Never    Smokeless tobacco: Never   Vaping Use    Vaping Use: Not on file   Substance and Sexual Activity    Alcohol use: Never    Drug use: Not on file    Sexual activity: Not on file   Other Topics  Concern    Not on file   Social History Narrative    Not on file     Social Determinants of Health     Financial Resource Strain: Not on file   Food Insecurity: Not on file   Transportation Needs: Not on file   Physical Activity: Not on file   Stress: Not on file   Social Connections: Not on file   Intimate Partner Violence: Not on file   Housing Stability: Not on file          ROS  Other than what was mentioned in the history of present illness, all other ROS negative        VITAL SIGNS  BP 112/72    Pulse 83    Temp 98.5 ??F (36.9 ??C)    Resp 16    Wt 247 lb 6.4 oz (112.2 kg)    SpO2 98%        PHYSICAL EXAM  Physical Exam  Constitutional:  General: She is not in acute distress.     Appearance: Normal appearance.   HENT:      Head: Normocephalic and atraumatic.      Right Ear: Tympanic membrane, ear canal and external ear normal.      Left Ear: Tympanic membrane, ear canal and external ear normal.      Nose: Nose normal. No rhinorrhea.      Mouth/Throat:      Mouth: Mucous membranes are moist.      Pharynx: Oropharynx is clear. No oropharyngeal exudate or posterior oropharyngeal erythema.   Eyes:      Extraocular Movements: Extraocular movements intact.      Pupils: Pupils are equal, round, and reactive to light.   Cardiovascular:      Rate and Rhythm: Normal rate and regular rhythm.      Heart sounds: No murmur heard.    No gallop.   Pulmonary:      Effort: Pulmonary effort is normal. No respiratory distress.      Breath sounds: No wheezing, rhonchi or rales.   Musculoskeletal:         General: No swelling or deformity. Normal range of motion.      Cervical back: Normal range of motion and neck supple.   Lymphadenopathy:      Cervical: No cervical adenopathy.   Skin:     General: Skin is warm and dry.      Findings: No rash.   Neurological:      Mental Status: She is alert.      Gait: Gait normal.   Psychiatric:         Mood and Affect: Mood normal.          TODAY'S RESULTS  Results for orders placed or  performed in visit on 03/16/21   POC Influenza Assay w/Optic (41324)   Result Value Ref Range    Valid Internal Control, POC yes     Flu A Antigen negative     Flu B Antigen negative           ASSESSMENT & PLAN  1. Flu-like symptoms  -     oseltamivir (TAMIFLU) 75 MG capsule; Take 1 capsule by mouth 2 times daily for 5 days, Disp-10 capsule, R-0Print  2. Fever, unspecified fever cause  -     POC Influenza Assay w/Optic (40102)  3. Exposure to the flu  -     oseltamivir (TAMIFLU) 75 MG capsule; Take 1 capsule by mouth 2 times daily for 5 days, Disp-10 capsule, R-0Print     Rapid flu only negative in clinic today. Reviewed results with patient. She has flu like symptoms with known exposure from multiple family members in the house with flu. Discussed Tamiflu and patient would like to take it, script printed.   Discussed course and length of viral illness, self quarantine at home for at least 5 days from symptom onset, avoid close direct contact, good hand hygiene. Should be 24 hours fever free without medication and symptoms improved before coming out of quarantine. Discussed supportive care, Tylenol or Motrin if needed and hydration  Continue to use inhaler as needed.   If develops any new or worsening symptoms, can be re-evaluated.  If symptoms become severe, develops difficulty breathing or any other alarming signs or symptoms, to go to the ED at that time.   Patient agreed and verbalized understanding of recommendations and plan.         An electronic signature was  used to authenticate this note.    --Suszanne Conners, PA-C

## 2021-07-27 LAB — FETAL NONSTRESS TEST

## 2022-06-06 ENCOUNTER — Ambulatory Visit
Admit: 2022-06-06 | Discharge: 2022-06-06 | Payer: PRIVATE HEALTH INSURANCE | Attending: Emergency Medicine | Primary: Diagnostic Radiology

## 2022-06-06 DIAGNOSIS — J101 Influenza due to other identified influenza virus with other respiratory manifestations: Secondary | ICD-10-CM

## 2022-06-06 LAB — AMB POC RAPID STREP A: Group A Strep Antigen, POC: NEGATIVE

## 2022-06-06 LAB — POC COVID-19 & INFLUENZA COMBO (LIAT IN HOUSE)
INFLUENZA A: NOT DETECTED
INFLUENZA B: DETECTED — AB
SARS-CoV-2: NOT DETECTED

## 2022-06-06 MED ORDER — OSELTAMIVIR PHOSPHATE 75 MG PO CAPS
75 MG | ORAL_CAPSULE | Freq: Two times a day (BID) | ORAL | 0 refills | Status: AC
Start: 2022-06-06 — End: 2022-06-11

## 2022-06-06 NOTE — Progress Notes (Addendum)
Ashley Baker (DOB:  1985-08-13) is a 37 y.o. female, here for evaluation of the following chief complaint(s): Fever (Started Thursday- chills-fever-nasal congestion-sore throat-cough- facial pressure- headache-body aches)           Subjective   SUBJECTIVE/OBJECTIVE:      Fever     Three days of fever, chills, cough, sore throat, nasal congestion and pressure.  She is coughing up discolored sputum.  Without wheezing or SOB.    No Known Allergies     Current Outpatient Medications on File Prior to Visit   Medication Sig Dispense Refill    etonogestrel-ethinyl estradiol (NUVARING) 0.12-0.015 MG/24HR vaginal ring Place 1 each vaginally See Admin Instructions      albuterol sulfate HFA (PROVENTIL;VENTOLIN;PROAIR) 108 (90 Base) MCG/ACT inhaler INHALE 1 PUFF AS NEEDED INTO THE LUNGS EVERY 4 HOURS FOR 30 DAYS      vitamin E 400 UNIT capsule Take 1 capsule by mouth daily       No current facility-administered medications on file prior to visit.       Past Medical History:   Diagnosis Date    Asthma         Past Surgical History:   Procedure Laterality Date    OVARIAN CYST REMOVAL      WISDOM TOOTH EXTRACTION N/A         Social History     Tobacco Use    Smoking status: Never    Smokeless tobacco: Never   Substance Use Topics    Alcohol use: Never        Review of Systems   Constitutional:  Positive for fever.     See HPI; otherwise ROS negative.         Objective   BP 103/73   Pulse (!) 107   Temp (!) 102.5 F (39.2 C)   Resp 18   Ht 1.6 m (5\' 3" )   Wt 107.5 kg (237 lb)   SpO2 95%   BMI 41.98 kg/m    Physical Exam  Vitals and nursing note reviewed.   Constitutional:       Comments: WD, alert, pleasant, mild distress.   HENT:      Nose: Congestion present. No rhinorrhea.      Mouth/Throat:      Mouth: Mucous membranes are moist.      Pharynx: Oropharynx is clear. No oropharyngeal exudate or posterior oropharyngeal erythema.   Cardiovascular:      Rate and Rhythm: Normal rate and regular rhythm.      Heart sounds:  Normal heart sounds.   Pulmonary:      Effort: Pulmonary effort is normal. No respiratory distress.      Breath sounds: Normal breath sounds. No wheezing or rales.   Musculoskeletal:      Cervical back: Neck supple.   Skin:     General: Skin is warm and dry.   Psychiatric:         Mood and Affect: Mood normal.         Behavior: Behavior normal.         Thought Content: Thought content normal.         Judgment: Judgment normal.       Results for orders placed or performed in visit on 06/06/22   AMB POC RAPID STREP A   Result Value Ref Range    Valid Internal Control, POC pass     Group A Strep Antigen, POC Negative    POC COVID-19 & Influenza  Combo ([facility] [place])   Result Value Ref Range    SARS-CoV-2 Not Detected Not Detected    INFLUENZA A Not Detected Not Detected    INFLUENZA B Detected (A) Not Detected             ASSESSMENT/PLAN:  1. Influenza B  -     oseltamivir (TAMIFLU) 75 MG capsule; Take 1 capsule by mouth 2 times daily for 5 days, Disp-10 capsule, R-0Normal  2. Fever, unspecified fever cause  -     AMB POC RAPID STREP A  -     POC COVID-19 & Influenza Combo ([facility] in House)    Influenza information to chart and discussed.  Fever information to chart.  Discussed fever reduction with Tylenol and ibuprofen.  To call our office or return with any questions, concerns or worsening signs or symptoms.    An electronic signature was used to authenticate this note.    --Karenann Cai, MD

## 2022-06-06 NOTE — Addendum Note (Signed)
Addended by: Marigene Ehlers F on: 06/06/2022 12:19 PM     Modules accepted: Orders

## 2023-04-19 ENCOUNTER — Ambulatory Visit
Admit: 2023-04-19 | Discharge: 2023-04-20 | Payer: PRIVATE HEALTH INSURANCE | Attending: Physician Assistant | Admitting: Physician Assistant | Primary: Diagnostic Radiology

## 2023-04-19 DIAGNOSIS — R051 Acute cough: Secondary | ICD-10-CM

## 2023-04-19 MED ORDER — METHYLPREDNISOLONE ACETATE 40 MG/ML IJ SUSP
40 | Freq: Once | INTRAMUSCULAR | Status: AC
Start: 2023-04-19 — End: 2023-04-19
  Administered 2023-04-19: 40 mg via INTRAMUSCULAR

## 2023-04-19 MED ORDER — DEXAMETHASONE SODIUM PHOSPHATE 4 MG/ML IJ SOLN
4 | Freq: Once | INTRAMUSCULAR | Status: AC
Start: 2023-04-19 — End: 2023-04-19
  Administered 2023-04-19: 4 mg via INTRAMUSCULAR

## 2023-04-19 MED ORDER — IPRATROPIUM-ALBUTEROL 0.5-2.5 (3) MG/3ML IN SOLN
0.5-2.5 | Freq: Once | RESPIRATORY_TRACT | Status: AC
Start: 2023-04-19 — End: 2023-04-19
  Administered 2023-04-19: 1 via RESPIRATORY_TRACT

## 2023-04-19 NOTE — Progress Notes (Signed)
 ASSESSMENT/PLAN:     1. Acute cough  -     dexAMETHasone (DECADRON) injection 4 mg; 4 mg, IntraMUSCular, ONCE, 1 dose, On Tue 04/19/23 at 1845  -     methylPREDNISolone acetate (DEPO-MEDROL) injection 40 mg; 40 mg, IntraMUSCular, ONCE, 1 dose, On Tue 1

## 2023-04-20 VITALS — BP 114/73 | HR 83 | Temp 98.10000°F | Resp 18 | Ht 63.0 in | Wt 235.2 lb

## 2023-04-20 MED ORDER — AZITHROMYCIN 250 MG PO TABS
250 | ORAL_TABLET | ORAL | 0 refills | Status: AC
Start: 2023-04-20 — End: 2023-04-24

## 2023-04-20 MED ORDER — ALBUTEROL SULFATE HFA 108 (90 BASE) MCG/ACT IN AERS
108 | Freq: Four times a day (QID) | RESPIRATORY_TRACT | 0 refills | Status: AC | PRN
Start: 2023-04-20 — End: 2023-05-19

## 2023-04-20 MED ORDER — METHYLPREDNISOLONE 4 MG PO TBPK
4 | PACK | ORAL | 0 refills | Status: AC
Start: 2023-04-20 — End: ?

## 2023-04-20 MED ORDER — BENZONATATE 200 MG PO CAPS
200 | ORAL_CAPSULE | Freq: Three times a day (TID) | ORAL | 0 refills | Status: AC | PRN
Start: 2023-04-20 — End: 2023-05-04

## 2023-05-17 ENCOUNTER — Encounter
